# Patient Record
Sex: Female | Born: 1985
Health system: Southern US, Community
[De-identification: ages and names within clinical notes are randomized; demographics above are authoritative.]

## PROBLEM LIST (undated history)

## (undated) ENCOUNTER — Inpatient Hospital Stay (HOSPITAL_COMMUNITY): Payer: Self-pay

## (undated) DIAGNOSIS — Z789 Other specified health status: Secondary | ICD-10-CM

## (undated) DIAGNOSIS — N390 Urinary tract infection, site not specified: Secondary | ICD-10-CM

## (undated) DIAGNOSIS — B019 Varicella without complication: Secondary | ICD-10-CM

## (undated) DIAGNOSIS — Z8619 Personal history of other infectious and parasitic diseases: Secondary | ICD-10-CM

## (undated) HISTORY — DX: Personal history of other infectious and parasitic diseases: Z86.19

## (undated) HISTORY — DX: Urinary tract infection, site not specified: N39.0

## (undated) HISTORY — DX: Varicella without complication: B01.9

---

## 2005-01-08 ENCOUNTER — Emergency Department (HOSPITAL_COMMUNITY): Admission: EM | Admit: 2005-01-08 | Discharge: 2005-01-08 | Payer: Self-pay | Admitting: Emergency Medicine

## 2011-06-14 NOTE — L&D Delivery Note (Signed)
Delivery Note Pt complete at 1823. Pushing started at 1828, and pushed great to SVD at 6:49 PM. A viable female "Phoenix" was delivered via  (Presentation: Left Occiput Anterior w/ compound hand presentation); loose shoulder cord reduced at time of delivery.  Newborn w/ spontaneous cry w/ drying and stimulation.  Cord doubly clamped and cut by FOB.  APGAR: 9, 9; weight 6 lb 2.4 oz (2790 g).   Placenta status: Intact, Spontaneous, Tomasa Blase; Battledore insertion.  Cord: 3 vessels with the following complications: None.  Cord pH: n/a.  Anesthesia: Epidural  Episiotomy: None Lacerations: Sulcus; Lt of pt's ML; 2nd degree Suture Repair: 3-0 monocryl; 3 interrupted stitches Est. Blood Loss (mL): 150  Mom to postpartum.  Baby to nursery-stable. Plans to BF and inpatient circumcision. Pt's urine cleared after IVFB intrapartum; will CTO closely.  Jermiya Reichl H 02/19/2012, 7:22 PM

## 2011-07-21 ENCOUNTER — Inpatient Hospital Stay (HOSPITAL_COMMUNITY)
Admission: AD | Admit: 2011-07-21 | Discharge: 2011-07-21 | Disposition: A | Discharge status: Home / Self Care | Payer: Self-pay | Source: Ambulatory Visit | Attending: Obstetrics and Gynecology | Admitting: Obstetrics and Gynecology

## 2011-07-21 ENCOUNTER — Encounter (HOSPITAL_COMMUNITY): Payer: Self-pay | Admitting: *Deleted

## 2011-07-21 DIAGNOSIS — O210 Mild hyperemesis gravidarum: Secondary | ICD-10-CM | POA: Insufficient documentation

## 2011-07-21 DIAGNOSIS — K5900 Constipation, unspecified: Secondary | ICD-10-CM | POA: Insufficient documentation

## 2011-07-21 DIAGNOSIS — O2691 Pregnancy related conditions, unspecified, first trimester: Secondary | ICD-10-CM

## 2011-07-21 DIAGNOSIS — R110 Nausea: Secondary | ICD-10-CM

## 2011-07-21 HISTORY — DX: Other specified health status: Z78.9

## 2011-07-21 LAB — URINALYSIS, ROUTINE W REFLEX MICROSCOPIC
Bilirubin Urine: NEGATIVE
Nitrite: POSITIVE — AB
Protein, ur: NEGATIVE mg/dL
Specific Gravity, Urine: 1.025 (ref 1.005–1.030)
Urobilinogen, UA: 2 mg/dL — ABNORMAL HIGH (ref 0.0–1.0)

## 2011-07-21 LAB — URINE MICROSCOPIC-ADD ON

## 2011-07-21 MED ORDER — ONDANSETRON 8 MG PO TBDP: 8.0000 mg | ORAL_TABLET | Freq: Three times a day (TID) | ORAL | Status: AC | PRN

## 2011-07-21 MED ORDER — PROMETHAZINE HCL 50 MG PO TABS: 25.0000 mg | ORAL_TABLET | ORAL | Status: AC | PRN

## 2011-07-21 NOTE — Progress Notes (Signed)
History   26 yo g1p0 LMP certain 12/11 EDC 9/7 presents with c./o of nausea with constipation but had bm today, no vomiting with pregnancy, has PNC starting March, no diarrhea, constipation, or UTI s/s, no cramping or vag bleeding.  Chief Complaint  Patient presents with  . Morning Sickness  . Constipation     OB History    Grav Para Term Preterm Abortions TAB SAB Ect Mult Living   1               Past Medical History  Diagnosis Date  . No pertinent past medical history     History reviewed. No pertinent past surgical history.  Family History  Problem Relation Age of Onset  . Anesthesia problems Neg Hx     History  Substance Use Topics  . Smoking status: Never Smoker   . Smokeless tobacco: Never Used  . Alcohol Use: No    Allergies:  Allergies  Allergen Reactions  . Latex Itching    Prescriptions prior to admission  Medication Sig Dispense Refill  . Prenatal Vit-Fe Fumarate-FA (PRENATAL MULTIVITAMIN) TABS Take 1 tablet by mouth daily.         Physical Exam  abd soft, nontender, no distress, cheerful talkative Blood pressure 114/67, pulse 70, temperature 98.4 F (36.9 C), temperature source Oral, resp. rate 18, height 5\' 4"  (1.626 m), weight 173 lb (78.472 kg), last menstrual period 05/24/2011.    ED Course  8 week pregnancy by reliable LMP Nausea P discussed protein diet, RX zofran and phenergan, encouraged 8 water daily, miralax as needed, f/o office as scheduled. Lavera Guise, CNM

## 2011-08-12 ENCOUNTER — Encounter (INDEPENDENT_AMBULATORY_CARE_PROVIDER_SITE_OTHER): Payer: BC Managed Care – PPO

## 2011-08-12 DIAGNOSIS — Z3480 Encounter for supervision of other normal pregnancy, unspecified trimester: Secondary | ICD-10-CM

## 2011-08-12 LAB — RUBELLA ANTIBODY, IGM: Rubella: IMMUNE

## 2011-08-12 LAB — OB RESULTS CONSOLE ABO/RH: RH Type: POSITIVE

## 2011-08-12 LAB — OB RESULTS CONSOLE ANTIBODY SCREEN: Antibody Screen: NEGATIVE

## 2011-08-12 LAB — CBC
HCT: 38 % (ref 36–46)
Platelets: 239 10*3/uL (ref 150–399)

## 2011-08-12 LAB — OB RESULTS CONSOLE HEPATITIS B SURFACE ANTIGEN: Hepatitis B Surface Ag: NEGATIVE

## 2011-08-12 LAB — OB RESULTS CONSOLE HIV ANTIBODY (ROUTINE TESTING): HIV: NONREACTIVE

## 2011-08-23 ENCOUNTER — Encounter (INDEPENDENT_AMBULATORY_CARE_PROVIDER_SITE_OTHER): Payer: BC Managed Care – PPO

## 2011-08-23 ENCOUNTER — Other Ambulatory Visit: Payer: BC Managed Care – PPO

## 2011-08-23 DIAGNOSIS — Z36 Encounter for antenatal screening of mother: Secondary | ICD-10-CM

## 2011-08-29 ENCOUNTER — Other Ambulatory Visit: Payer: BC Managed Care – PPO | Admitting: Registered Nurse

## 2011-08-29 ENCOUNTER — Encounter (INDEPENDENT_AMBULATORY_CARE_PROVIDER_SITE_OTHER): Payer: BC Managed Care – PPO | Admitting: Obstetrics and Gynecology

## 2011-08-29 DIAGNOSIS — Z01419 Encounter for gynecological examination (general) (routine) without abnormal findings: Secondary | ICD-10-CM

## 2011-08-29 DIAGNOSIS — Z331 Pregnant state, incidental: Secondary | ICD-10-CM

## 2011-09-08 ENCOUNTER — Other Ambulatory Visit (INDEPENDENT_AMBULATORY_CARE_PROVIDER_SITE_OTHER): Payer: BC Managed Care – PPO

## 2011-09-08 DIAGNOSIS — Z331 Pregnant state, incidental: Secondary | ICD-10-CM

## 2011-09-23 DIAGNOSIS — Z8279 Family history of other congenital malformations, deformations and chromosomal abnormalities: Secondary | ICD-10-CM | POA: Insufficient documentation

## 2011-09-23 DIAGNOSIS — N926 Irregular menstruation, unspecified: Secondary | ICD-10-CM | POA: Insufficient documentation

## 2011-09-26 ENCOUNTER — Encounter: Payer: Self-pay | Admitting: Obstetrics and Gynecology

## 2011-09-26 ENCOUNTER — Ambulatory Visit (INDEPENDENT_AMBULATORY_CARE_PROVIDER_SITE_OTHER): Payer: BC Managed Care – PPO | Admitting: Obstetrics and Gynecology

## 2011-09-26 VITALS — BP 118/60 | Wt 171.0 lb

## 2011-09-26 DIAGNOSIS — Z8279 Family history of other congenital malformations, deformations and chromosomal abnormalities: Secondary | ICD-10-CM

## 2011-09-26 NOTE — Progress Notes (Signed)
Pt c/o continued morning sickness and no apetite, unsure what fetal movement feels like.

## 2011-09-26 NOTE — Progress Notes (Signed)
Nausea improved.  Appetite poor.  Recommended small frequent meals. First trimester screen, AFP nl  NOB labs reviewed

## 2011-10-10 ENCOUNTER — Ambulatory Visit (INDEPENDENT_AMBULATORY_CARE_PROVIDER_SITE_OTHER): Payer: BC Managed Care – PPO | Admitting: Obstetrics and Gynecology

## 2011-10-10 ENCOUNTER — Ambulatory Visit (INDEPENDENT_AMBULATORY_CARE_PROVIDER_SITE_OTHER): Payer: BC Managed Care – PPO

## 2011-10-10 ENCOUNTER — Encounter: Payer: Self-pay | Admitting: Obstetrics and Gynecology

## 2011-10-10 ENCOUNTER — Other Ambulatory Visit: Payer: BC Managed Care – PPO

## 2011-10-10 VITALS — BP 90/70 | Wt 171.0 lb

## 2011-10-10 DIAGNOSIS — Z8279 Family history of other congenital malformations, deformations and chromosomal abnormalities: Secondary | ICD-10-CM

## 2011-10-10 DIAGNOSIS — Z3400 Encounter for supervision of normal first pregnancy, unspecified trimester: Secondary | ICD-10-CM

## 2011-10-10 LAB — US OB COMP + 14 WK

## 2011-10-10 NOTE — Progress Notes (Signed)
Ultrasound shows:  SIUP  S=D                 AFI: nl            Cervical length: closed 3.76 cm           Placenta localization: anterior           Fetal presentation: cephalic                   Anatomy survey is normal           Gender : female  No complaints Questions answered U/s reviewed RTO 4wks

## 2011-11-08 ENCOUNTER — Ambulatory Visit (INDEPENDENT_AMBULATORY_CARE_PROVIDER_SITE_OTHER): Payer: BC Managed Care – PPO | Admitting: Obstetrics and Gynecology

## 2011-11-08 VITALS — BP 122/66 | Wt 172.0 lb

## 2011-11-08 DIAGNOSIS — Z3480 Encounter for supervision of other normal pregnancy, unspecified trimester: Secondary | ICD-10-CM

## 2011-11-08 DIAGNOSIS — Z3490 Encounter for supervision of normal pregnancy, unspecified, unspecified trimester: Secondary | ICD-10-CM | POA: Insufficient documentation

## 2011-11-08 NOTE — Progress Notes (Signed)
Doing well 1hr glu NV Blood type Apos

## 2011-12-06 ENCOUNTER — Encounter: Payer: Self-pay | Admitting: Obstetrics and Gynecology

## 2011-12-06 ENCOUNTER — Other Ambulatory Visit: Payer: BC Managed Care – PPO

## 2011-12-06 ENCOUNTER — Ambulatory Visit (INDEPENDENT_AMBULATORY_CARE_PROVIDER_SITE_OTHER): Payer: BC Managed Care – PPO | Admitting: Obstetrics and Gynecology

## 2011-12-06 VITALS — BP 90/60 | Wt 177.0 lb

## 2011-12-06 DIAGNOSIS — Z331 Pregnant state, incidental: Secondary | ICD-10-CM

## 2011-12-06 NOTE — Progress Notes (Signed)
Glucola, Hgb, RPR today.  A+. Just returned from [place] to see mom--misses her very much. Mom is coming to [facility] when she has the baby. Support offered, reassured.

## 2011-12-06 NOTE — Progress Notes (Signed)
No complaints. glucola given.

## 2011-12-07 LAB — RPR

## 2011-12-09 LAB — GLUCOSE TOLERANCE, 1 HOUR (50G) W/O FASTING: Glucose, 1 Hour GTT: 119 mg/dL (ref 70–140)

## 2011-12-19 ENCOUNTER — Encounter: Payer: Self-pay | Admitting: Obstetrics and Gynecology

## 2011-12-19 ENCOUNTER — Ambulatory Visit (INDEPENDENT_AMBULATORY_CARE_PROVIDER_SITE_OTHER): Payer: BC Managed Care – PPO | Admitting: Obstetrics and Gynecology

## 2011-12-19 VITALS — BP 98/56 | Wt 175.0 lb

## 2011-12-19 DIAGNOSIS — Z331 Pregnant state, incidental: Secondary | ICD-10-CM

## 2011-12-19 NOTE — Progress Notes (Signed)
glucola 119, RPR NR, HGB 10.9 FKC reviewed RT 2 weeks

## 2011-12-19 NOTE — Patient Instructions (Signed)
Fetal Monitoring, Fetal Movement Assessment Fetal movement assessment (FMA) is done by the pregnant woman herself by counting and recording the baby's movements over a certain time period. It is done to see if there are problems with the pregnancy and the baby. Identifying and correcting problems may prevent serious problems from developing with the fetus, including fetal loss. Some pregnancies are complicated by the mother's medical problems. Some of these problems are type 1 diabetes mellitus, high blood pressure and other chronic medical illnesses. This is why it is important to monitor the baby before birth.  OTHER TECHNIQUES OF MONITORING YOUR BABY BEFORE BIRTH Several tests are in use. These include:  Nonstress test (NST). This test monitors the baby's heart rate when the baby moves.   Contraction stress test (CST). This test monitors the baby's heart rate during a contraction of the uterus.   Fetal biophysical profile (BPP). This measures and evaluates 5 observations of the baby:   The nonstress test.   The baby's breathing.   The baby's movements.   The baby's muscle tone.   The amount of amniotic fluid.   Modified BPP. This measures the volume of fluid in different parts of the amniotic sac (amniotic fluid index) and the results of the nonstress test.   Umbilical artery doppler velocimetry. This evaluates the blood flow through the umbilical cord.  There are several very serious problems that cannot be predicted or detected with any of the fetal monitoring procedures. These problems include separation (abruption) of the placenta or when the fetus chokes on the umbilical cord (umbilical cord accident). Your caregiver will help you understand your tests and what they mean for you and your baby. It is your responsibility to obtain the results of your test. LET YOUR CAREGIVER KNOW ABOUT:   Any medications you are taking including prescription and over-the-counter drugs, herbs, eye  drops and creams.   If you have a fever.   If you have an infection.   If you are sick.  RISKS AND COMPLICATIONS  There are no risks or complications to the mother or fetus with FMA. BEFORE THE PROCEDURE  Do not take medications that may decrease or increase the baby's heart rate and/or movements.   Eat a full meal at least 2 hours before the test.   Do not smoke if you are pregnant. If you smoke, stop at least 2 days before the test. It is best not to smoke at all when you are pregnant.  PROCEDURE Sometimes, a mother notices her baby moves less before there are problems. Because of this, it is believed that fetal movement checking by the mother (kick counts) is a good way to check the baby before birth. There are different ways of doing this. Two good ways are:  The woman lies on her side and counts distinct (individual) fetal movements. A feeling of 10 distinct movements in a period of up to 2 hours is considered reassuring. When 10 movements are felt, you may stop counting.   Women are instructed to count fetal movements for 1 hour, three times per week. The count is good if, after one week, it equals or is over the woman's previously established baseline count. If the count is lower, further checking of your baby is needed.  AFTER THE PROCEDURE You may resume your usual activities. HOME CARE INSTRUCTIONS   Follow your caregiver's advice and recommendations.   Be aware of your baby's movements. Are they normal, less than usual or more than usual?     Make and keep the rest of your prenatal appointments.  SEEK MEDICAL CARE IF:   You develop a temperature of 100 F (37.8 C) or higher.   You have a bloody mucus discharge from the vagina (a bloody show).  SEEK IMMEDIATE MEDICAL CARE IF:   You do not feel the baby move.   You think the baby's movements are too little or too many.   You develop contractions.   You develop vaginal bleeding.   You have belly (abdominal) pain.    You have leaking or a gush of fluid from the vagina.  Document Released: 05/20/2002 Document Revised: 05/19/2011 Document Reviewed: 09/22/2008 [employer] Patient Information 2012 [employer]. 

## 2011-12-19 NOTE — Progress Notes (Signed)
Pt states no concerns today.   

## 2012-01-02 ENCOUNTER — Ambulatory Visit (INDEPENDENT_AMBULATORY_CARE_PROVIDER_SITE_OTHER): Payer: BC Managed Care – PPO | Admitting: Obstetrics and Gynecology

## 2012-01-02 ENCOUNTER — Encounter: Payer: Self-pay | Admitting: Obstetrics and Gynecology

## 2012-01-02 VITALS — BP 102/62 | Wt 177.0 lb

## 2012-01-02 DIAGNOSIS — D649 Anemia, unspecified: Secondary | ICD-10-CM

## 2012-01-02 DIAGNOSIS — Z3490 Encounter for supervision of normal pregnancy, unspecified, unspecified trimester: Secondary | ICD-10-CM

## 2012-01-02 DIAGNOSIS — Z331 Pregnant state, incidental: Secondary | ICD-10-CM

## 2012-01-02 NOTE — Progress Notes (Signed)
No complaints

## 2012-01-02 NOTE — Addendum Note (Signed)
Addended by: Janeece Agee on: 01/02/2012 10:45 AM   Modules accepted: Orders

## 2012-01-02 NOTE — Progress Notes (Signed)
[redacted]w[redacted]d Anemia - ( 10.9) will start po Iron to help. ROb x 2 weeks Repeat CBC at next visit.

## 2012-01-04 ENCOUNTER — Telehealth: Payer: Self-pay | Admitting: Obstetrics and Gynecology

## 2012-01-17 ENCOUNTER — Ambulatory Visit (INDEPENDENT_AMBULATORY_CARE_PROVIDER_SITE_OTHER): Payer: BC Managed Care – PPO

## 2012-01-17 VITALS — BP 116/68 | Wt 177.0 lb

## 2012-01-17 DIAGNOSIS — H9209 Otalgia, unspecified ear: Secondary | ICD-10-CM

## 2012-01-17 DIAGNOSIS — H9202 Otalgia, left ear: Secondary | ICD-10-CM

## 2012-01-17 NOTE — Progress Notes (Signed)
C/O: Pain in left ear since Saturday.

## 2012-01-17 NOTE — Progress Notes (Signed)
[redacted]w[redacted]d.  C/o Lt ear pain since Sat.  No other c/o's.  Heat and cold sensitivity and pain w/ chewing.  Using heating pad at night. Pain worse w/ meals and after lying down at night.  Ears WNL bilaterally.  Still has wisdom teeth and suspect tooth issue vs. Ear or sinus.  No headache or sinus pressure.  Rec'd ES Tylenol q 6 hrs prn; warm water bottle to ear prn comfort instead of heating pad and rec'd seeking dental exam.  No PTL s/s.  S.o. At visit.  disc'd circ.  Plans to BF.  No PTL s/s.  GFM.  GBS NV.

## 2012-02-01 ENCOUNTER — Ambulatory Visit (INDEPENDENT_AMBULATORY_CARE_PROVIDER_SITE_OTHER): Payer: BC Managed Care – PPO

## 2012-02-01 VITALS — BP 108/72 | Wt 178.0 lb

## 2012-02-01 DIAGNOSIS — Z331 Pregnant state, incidental: Secondary | ICD-10-CM

## 2012-02-01 DIAGNOSIS — Z3490 Encounter for supervision of normal pregnancy, unspecified, unspecified trimester: Secondary | ICD-10-CM

## 2012-02-01 NOTE — Progress Notes (Signed)
Pt desires cervix check today. Pt has no concerns.

## 2012-02-01 NOTE — Progress Notes (Signed)
[redacted]w[redacted]d Ear pain has improved--think TMJ; has been laying more on Rt side.  GFM.  Rev'd labor s/s & FKC. Declined pelvic after wait time; GBS NV.

## 2012-02-09 ENCOUNTER — Ambulatory Visit (INDEPENDENT_AMBULATORY_CARE_PROVIDER_SITE_OTHER): Payer: BC Managed Care – PPO | Admitting: Obstetrics and Gynecology

## 2012-02-09 ENCOUNTER — Encounter: Payer: Self-pay | Admitting: Obstetrics and Gynecology

## 2012-02-09 VITALS — BP 118/70 | Wt 180.0 lb

## 2012-02-09 DIAGNOSIS — Z3480 Encounter for supervision of other normal pregnancy, unspecified trimester: Secondary | ICD-10-CM

## 2012-02-09 LAB — OB RESULTS CONSOLE GBS: GBS: NEGATIVE

## 2012-02-09 NOTE — Progress Notes (Signed)
Pt states no concerns today.   

## 2012-02-09 NOTE — Progress Notes (Signed)
A/P GBS done Fetal kick counts reviewed Labor reviewed with pt All patients  questions answered 

## 2012-02-09 NOTE — Patient Instructions (Signed)

## 2012-02-12 LAB — CULTURE, BETA STREP (GROUP B ONLY)

## 2012-02-16 ENCOUNTER — Ambulatory Visit (INDEPENDENT_AMBULATORY_CARE_PROVIDER_SITE_OTHER): Payer: BC Managed Care – PPO | Admitting: Obstetrics and Gynecology

## 2012-02-16 ENCOUNTER — Encounter: Payer: Self-pay | Admitting: Obstetrics and Gynecology

## 2012-02-16 VITALS — BP 102/62 | Wt 180.0 lb

## 2012-02-16 DIAGNOSIS — Z331 Pregnant state, incidental: Secondary | ICD-10-CM

## 2012-02-16 DIAGNOSIS — Z3490 Encounter for supervision of normal pregnancy, unspecified, unspecified trimester: Secondary | ICD-10-CM

## 2012-02-16 NOTE — Progress Notes (Signed)
Office Visit on 02/09/2012  Component Date Value Range Status  . Organism ID, Bacteria 02/09/2012 NO GROUP B STREP (S.AGALACTIAE) ISOLATED   Final   Wants cervix checked No complaints other than pressure FKCs and Labor Precautions

## 2012-02-19 ENCOUNTER — Inpatient Hospital Stay (HOSPITAL_COMMUNITY): Payer: BC Managed Care – PPO | Admitting: Anesthesiology

## 2012-02-19 ENCOUNTER — Encounter (HOSPITAL_COMMUNITY): Payer: Self-pay | Admitting: Anesthesiology

## 2012-02-19 ENCOUNTER — Encounter (HOSPITAL_COMMUNITY): Payer: Self-pay | Admitting: *Deleted

## 2012-02-19 ENCOUNTER — Inpatient Hospital Stay (HOSPITAL_COMMUNITY)
Admission: AD | Admit: 2012-02-19 | Discharge: 2012-02-21 | DRG: 373 | Disposition: A | Discharge status: Home / Self Care | Payer: BC Managed Care – PPO | Source: Ambulatory Visit | Attending: Obstetrics and Gynecology | Admitting: Obstetrics and Gynecology

## 2012-02-19 LAB — CBC
MCH: 29.4 pg (ref 26.0–34.0)
MCHC: 33.8 g/dL (ref 30.0–36.0)
Platelets: 176 10*3/uL (ref 150–400)
RBC: 4.45 MIL/uL (ref 3.87–5.11)

## 2012-02-19 LAB — RPR: RPR Ser Ql: NONREACTIVE

## 2012-02-19 MED ORDER — FENTANYL 2.5 MCG/ML BUPIVACAINE 1/10 % EPIDURAL INFUSION (WH - ANES)
14.0000 mL/h | INTRAMUSCULAR | Status: DC
  Administered 2012-02-19 (×2): 14 mL/h via EPIDURAL
  Filled 2012-02-19 (×3): qty 60

## 2012-02-19 MED ORDER — CITRIC ACID-SODIUM CITRATE 334-500 MG/5ML PO SOLN: 30.0000 mL | ORAL | Status: DC | PRN

## 2012-02-19 MED ORDER — SENNOSIDES-DOCUSATE SODIUM 8.6-50 MG PO TABS
2.0000 | ORAL_TABLET | Freq: Every day | ORAL | Status: DC
  Administered 2012-02-19 – 2012-02-20 (×2): 2 via ORAL

## 2012-02-19 MED ORDER — ONDANSETRON HCL 4 MG/2ML IJ SOLN: 4.0000 mg | INTRAMUSCULAR | Status: DC | PRN

## 2012-02-19 MED ORDER — ACETAMINOPHEN 160 MG/5ML PO SOLN
650.0000 mg | Freq: Four times a day (QID) | ORAL | Status: DC | PRN
  Filled 2012-02-19: qty 20.3

## 2012-02-19 MED ORDER — OXYTOCIN BOLUS FROM INFUSION
500.0000 mL | Freq: Once | INTRAVENOUS | Status: DC
  Filled 2012-02-19: qty 500

## 2012-02-19 MED ORDER — ONDANSETRON HCL 4 MG PO TABS: 4.0000 mg | ORAL_TABLET | ORAL | Status: DC | PRN

## 2012-02-19 MED ORDER — ONDANSETRON HCL 4 MG/2ML IJ SOLN: 4.0000 mg | Freq: Four times a day (QID) | INTRAMUSCULAR | Status: DC | PRN

## 2012-02-19 MED ORDER — LIDOCAINE HCL (PF) 1 % IJ SOLN: 30.0000 mL | INTRAMUSCULAR | Status: DC | PRN

## 2012-02-19 MED ORDER — PRENATAL MULTIVITAMIN CH
1.0000 | ORAL_TABLET | Freq: Every day | ORAL | Status: DC
  Administered 2012-02-21: 1 via ORAL
  Filled 2012-02-19 (×2): qty 1

## 2012-02-19 MED ORDER — EPHEDRINE 5 MG/ML INJ
10.0000 mg | INTRAVENOUS | Status: DC | PRN
  Filled 2012-02-19: qty 4

## 2012-02-19 MED ORDER — LANOLIN HYDROUS EX OINT: TOPICAL_OINTMENT | CUTANEOUS | Status: DC | PRN

## 2012-02-19 MED ORDER — DIBUCAINE 1 % RE OINT: 1.0000 "application " | TOPICAL_OINTMENT | RECTAL | Status: DC | PRN

## 2012-02-19 MED ORDER — SIMETHICONE 80 MG PO CHEW: 80.0000 mg | CHEWABLE_TABLET | ORAL | Status: DC | PRN

## 2012-02-19 MED ORDER — WITCH HAZEL-GLYCERIN EX PADS: 1.0000 "application " | MEDICATED_PAD | CUTANEOUS | Status: DC | PRN

## 2012-02-19 MED ORDER — PHENYLEPHRINE 40 MCG/ML (10ML) SYRINGE FOR IV PUSH (FOR BLOOD PRESSURE SUPPORT)
80.0000 ug | PREFILLED_SYRINGE | INTRAVENOUS | Status: DC | PRN
  Filled 2012-02-19: qty 5

## 2012-02-19 MED ORDER — ACETAMINOPHEN 325 MG PO TABS: 650.0000 mg | ORAL_TABLET | ORAL | Status: DC | PRN

## 2012-02-19 MED ORDER — METHYLERGONOVINE MALEATE 0.2 MG/ML IJ SOLN: 0.2000 mg | INTRAMUSCULAR | Status: DC | PRN

## 2012-02-19 MED ORDER — METHYLERGONOVINE MALEATE 0.2 MG PO TABS: 0.2000 mg | ORAL_TABLET | ORAL | Status: DC | PRN

## 2012-02-19 MED ORDER — EPHEDRINE 5 MG/ML INJ: 10.0000 mg | INTRAVENOUS | Status: DC | PRN

## 2012-02-19 MED ORDER — LACTATED RINGERS IV SOLN: INTRAVENOUS | Status: DC

## 2012-02-19 MED ORDER — OXYTOCIN 40 UNITS IN LACTATED RINGERS INFUSION - SIMPLE MED: 62.5000 mL/h | Freq: Once | INTRAVENOUS | Status: DC

## 2012-02-19 MED ORDER — IBUPROFEN 600 MG PO TABS: 600.0000 mg | ORAL_TABLET | Freq: Four times a day (QID) | ORAL | Status: DC

## 2012-02-19 MED ORDER — IBUPROFEN 100 MG/5ML PO SUSP
600.0000 mg | Freq: Four times a day (QID) | ORAL | Status: DC | PRN
  Filled 2012-02-19: qty 30

## 2012-02-19 MED ORDER — DIPHENHYDRAMINE HCL 50 MG/ML IJ SOLN: 12.5000 mg | INTRAMUSCULAR | Status: DC | PRN

## 2012-02-19 MED ORDER — LACTATED RINGERS IV SOLN: 500.0000 mL | INTRAVENOUS | Status: DC | PRN

## 2012-02-19 MED ORDER — IBUPROFEN 600 MG PO TABS
600.0000 mg | ORAL_TABLET | Freq: Four times a day (QID) | ORAL | Status: DC | PRN
  Filled 2012-02-19: qty 1

## 2012-02-19 MED ORDER — PHENYLEPHRINE 40 MCG/ML (10ML) SYRINGE FOR IV PUSH (FOR BLOOD PRESSURE SUPPORT): 80.0000 ug | PREFILLED_SYRINGE | INTRAVENOUS | Status: DC | PRN

## 2012-02-19 MED ORDER — LIDOCAINE HCL (PF) 1 % IJ SOLN
INTRAMUSCULAR | Status: DC | PRN
  Administered 2012-02-19 (×2): 4 mL

## 2012-02-19 MED ORDER — DIPHENHYDRAMINE HCL 25 MG PO CAPS: 25.0000 mg | ORAL_CAPSULE | Freq: Four times a day (QID) | ORAL | Status: DC | PRN

## 2012-02-19 MED ORDER — TETANUS-DIPHTH-ACELL PERTUSSIS 5-2.5-18.5 LF-MCG/0.5 IM SUSP
0.5000 mL | Freq: Once | INTRAMUSCULAR | Status: AC
  Administered 2012-02-20: 0.5 mL via INTRAMUSCULAR
  Filled 2012-02-19: qty 0.5

## 2012-02-19 MED ORDER — OXYTOCIN 40 UNITS IN LACTATED RINGERS INFUSION - SIMPLE MED: 1.0000 m[IU]/min | INTRAVENOUS | Status: DC

## 2012-02-19 MED ORDER — FLEET ENEMA 7-19 GM/118ML RE ENEM: 1.0000 | ENEMA | RECTAL | Status: DC | PRN

## 2012-02-19 MED ORDER — FENTANYL 2.5 MCG/ML BUPIVACAINE 1/10 % EPIDURAL INFUSION (WH - ANES)
INTRAMUSCULAR | Status: DC | PRN
  Administered 2012-02-19: 14 mL/h via EPIDURAL

## 2012-02-19 MED ORDER — OXYCODONE-ACETAMINOPHEN 5-325 MG PO TABS: 1.0000 | ORAL_TABLET | ORAL | Status: DC | PRN

## 2012-02-19 MED ORDER — OXYTOCIN 40 UNITS IN LACTATED RINGERS INFUSION - SIMPLE MED
1.0000 m[IU]/min | INTRAVENOUS | Status: DC
  Administered 2012-02-19: 1 m[IU]/min via INTRAVENOUS
  Filled 2012-02-19: qty 1000

## 2012-02-19 MED ORDER — BENZOCAINE-MENTHOL 20-0.5 % EX AERO
1.0000 "application " | INHALATION_SPRAY | CUTANEOUS | Status: DC | PRN
  Administered 2012-02-19: 1 via TOPICAL
  Filled 2012-02-19: qty 56

## 2012-02-19 MED ORDER — ZOLPIDEM TARTRATE 5 MG PO TABS: 5.0000 mg | ORAL_TABLET | Freq: Every evening | ORAL | Status: DC | PRN

## 2012-02-19 MED ORDER — OXYCODONE-ACETAMINOPHEN 5-325 MG/5ML PO SOLN: 5.0000 mL | ORAL | Status: DC | PRN

## 2012-02-19 MED ORDER — LACTATED RINGERS IV SOLN: 500.0000 mL | Freq: Once | INTRAVENOUS | Status: DC

## 2012-02-19 MED ORDER — HYDROXYZINE HCL 50 MG PO TABS: 50.0000 mg | ORAL_TABLET | Freq: Four times a day (QID) | ORAL | Status: DC | PRN

## 2012-02-19 MED ORDER — IBUPROFEN 100 MG/5ML PO SUSP
600.0000 mg | Freq: Four times a day (QID) | ORAL | Status: DC
  Administered 2012-02-19 – 2012-02-21 (×6): 600 mg via ORAL
  Filled 2012-02-19 (×7): qty 30

## 2012-02-19 MED ORDER — HYDROXYZINE HCL 50 MG/ML IM SOLN: 50.0000 mg | Freq: Four times a day (QID) | INTRAMUSCULAR | Status: DC | PRN

## 2012-02-19 MED ORDER — TERBUTALINE SULFATE 1 MG/ML IJ SOLN: 0.2500 mg | Freq: Once | INTRAMUSCULAR | Status: DC | PRN

## 2012-02-19 MED ORDER — MAGNESIUM HYDROXIDE 400 MG/5ML PO SUSP: 30.0000 mL | ORAL | Status: DC | PRN

## 2012-02-19 NOTE — Anesthesia Procedure Notes (Signed)
Epidural Patient location during procedure: OB Start time: 02/19/2012 9:05 AM  Staffing Anesthesiologist: Najai Waszak A. Performed by: anesthesiologist   Preanesthetic Checklist Completed: patient identified, site marked, surgical consent, pre-op evaluation, timeout performed, IV checked, risks and benefits discussed and monitors and equipment checked  Epidural Patient position: sitting Prep: site prepped and draped and DuraPrep Patient monitoring: continuous pulse ox and blood pressure Approach: midline Injection technique: LOR air  Needle:  Needle type: Tuohy  Needle gauge: 17 G Needle length: 9 cm and 9 Needle insertion depth: 7 cm Catheter type: closed end flexible Catheter size: 19 Gauge Catheter at skin depth: 12 cm Test dose: negative and Other  Assessment Events: blood not aspirated, injection not painful, no injection resistance, negative IV test and no paresthesia  Additional Notes Patient identified. Risks and benefits discussed including failed block, incomplete  Pain control, post dural puncture headache, nerve damage, paralysis, blood pressure Changes, nausea, vomiting, reactions to medications-both toxic and allergic and post Partum back pain. All questions were answered. Patient expressed understanding and wished to proceed. Sterile technique was used throughout procedure. Epidural site was Dressed with sterile barrier dressing. No paresthesias, signs of intravascular injection Or signs of intrathecal spread were encountered.  Patient was more comfortable after the epidural was dosed. Please see RN's note for documentation of vital signs and FHR which are stable.

## 2012-02-19 NOTE — Anesthesia Preprocedure Evaluation (Signed)

## 2012-02-19 NOTE — Progress Notes (Signed)
Subjective: Pt comfortable s/p epidural; received around 0900.  Several visitors at bedside.  Pt smiling and w/o c/o's.  Objective: BP 120/80  Pulse 80  Temp 98.1 F (36.7 C) (Oral)  Resp 20  Ht 5\' 4"  (1.626 m)  Wt 182 lb (82.555 kg)  BMI 31.24 kg/m2  SpO2 99%  LMP 05/24/2011      FHT:  FHR: 130 bpm, variability: moderate,  accelerations:  Present,  decelerations:  Absent UC:   regular, every 2-3 minutes SVE:   Dilation: 7 Effacement (%): 90 Station: -1 Exam by:: Matilynn Dacey AROM mod amt clear fluid w/ sm amt bloody show Labs: Lab Results  Component Value Date   WBC 10.0 02/19/2012   HGB 13.1 02/19/2012   HCT 38.8 02/19/2012   MCV 87.2 02/19/2012   PLT 176 02/19/2012    Assessment / Plan: 1. [redacted]w[redacted]d 2. transition 3. GBS neg  Labor: Progressing normally Preeclampsia:  no signs or symptoms of toxicity Fetal Wellbeing:  Category I Pain Control:  Epidural I/D:  n/a Anticipated MOD:  NSVD 1.  Pitocin/IUPC prn augmentation 2.  C/w MD prn  Meloni Hinz H 02/19/2012, 11:38 AM

## 2012-02-19 NOTE — Progress Notes (Signed)
Subjective: Called to assess pt's urine in foley--now bloody w/ clot in foley bag.  RN also reports cx puffy anteriorly between 9-1 o'clock.  Pitocin started at 1500; currently on 3mu.  RN reports when i/o cath earlier today, urine yellow and clear.  Objective: BP 131/71  Pulse 84  Temp 98.5 F (36.9 C) (Oral)  Resp 20  Ht 5\' 4"  (1.626 m)  Wt 182 lb (82.555 kg)  BMI 31.24 kg/m2  SpO2 99%  LMP 05/24/2011      FHT:  FHR: 140 bpm, variability: moderate,  accelerations:  Present,  decelerations:  Present occ'l variable w/ late component UC:   irregular, every 1-6 minutes SVE:   Dilation: 7 Effacement (%): 90 Station: -1 Exam by:: m wilkins rnc Deferred cervical exam; urine in foley catheter bloody w/ clot noted.   MVU's inadequate.   Labs: Lab Results  Component Value Date   WBC 10.0 02/19/2012   HGB 13.1 02/19/2012   HCT 38.8 02/19/2012   MCV 87.2 02/19/2012   PLT 176 02/19/2012    Assessment / Plan: 1. [redacted]w[redacted]d 2. protracted active phase 3. suspect OP presentation 4.  blood in urine 5. GBS neg  Labor: augmentation w/ dysfunctional ctxs pattern w/ OP suspected Preeclampsia:  no signs or symptoms of toxicity Fetal Wellbeing:  Category I Pain Control:  Epidural I/D:  n/a Anticipated MOD:  NSVD 1.  Repositioned pt to Lt exaggerated Sims and will do IVFB now.  Will CTO closely. 2.  Will reposition frequently to attempt fetal rotation. 3.  C/w MD prn. Allee Busk H 02/19/2012, 4:47 PM

## 2012-02-19 NOTE — Progress Notes (Signed)
Subjective: No c/o's.  Some intermittent rectal pressure.  S.o. Remains at bedside and rotating guests.   Objective: BP 128/71  Pulse 74  Temp 98.2 F (36.8 C) (Oral)  Resp 20  Ht 5\' 4"  (1.626 m)  Wt 182 lb (82.555 kg)  BMI 31.24 kg/m2  SpO2 99%  LMP 05/24/2011      FHT:  FHR: 135 bpm, variability: moderate,  accelerations:  Present,  decelerations:  Present one prolonged decel around 1220; resolved w/ intervention and none since UC:   regular, every 2-4 minutes; sometimes couplet pattern SVE:   Dilation: 7 Effacement (%): 90 Station: -1 Exam by:: m wilkins rnc cx unchanged; IUPC inserted Labs: Lab Results  Component Value Date   WBC 10.0 02/19/2012   HGB 13.1 02/19/2012   HCT 38.8 02/19/2012   MCV 87.2 02/19/2012   PLT 176 02/19/2012    Assessment / Plan: Protracted active phase  Labor: no progress since AROM at 1130 Preeclampsia:  no signs or symptoms of toxicity Fetal Wellbeing:  Category I Pain Control:  Epidural I/D:  n/a Anticipated MOD:  NSVD 1.  If inadequate, will start Pitocin per low dose 2.  C/w MD prn  Jonia Oakey H 02/19/2012, 2:29 PM

## 2012-02-20 ENCOUNTER — Encounter (HOSPITAL_COMMUNITY): Payer: Self-pay | Admitting: *Deleted

## 2012-02-20 LAB — CBC
HCT: 32.2 % — ABNORMAL LOW (ref 36.0–46.0)
Hemoglobin: 10.8 g/dL — ABNORMAL LOW (ref 12.0–15.0)
MCV: 88.2 fL (ref 78.0–100.0)
RBC: 3.65 MIL/uL — ABNORMAL LOW (ref 3.87–5.11)
RDW: 14.3 % (ref 11.5–15.5)
WBC: 17.7 10*3/uL — ABNORMAL HIGH (ref 4.0–10.5)

## 2012-02-20 NOTE — Progress Notes (Signed)
Post Partum Day 1:S/P SVB Subjective: Doing well--up ad lib without syncope or dizziness.  Voiding spontaneously without difficulty or hematuria.  Plans inpatient circumcision. Feeding:  Breast Contraceptive plan:   Undecided at present.  Objective: Blood pressure 114/69, pulse 78, temperature 98 F (36.7 C), temperature source Oral, resp. rate 18, height 5\' 4"  (1.626 m), weight 182 lb (82.555 kg), last menstrual period 05/24/2011, SpO2 99.00%, unknown if currently breastfeeding.  Physical Exam:  General: alert Lochia: appropriate Uterine Fundus: firm Incision: healing well  DVT Evaluation: No evidence of DVT seen on physical exam. Negative Homan's sign.   Basename 02/20/12 0443 02/19/12 0825  HGB 10.8* 13.1  HCT 32.2* 38.8    Assessment/Plan: PP day 1--stable. Anticipate d/c tomorrow.   LOS: 1 day   Hyden Soley 02/20/2012, 8:50 AM

## 2012-02-20 NOTE — Anesthesia Postprocedure Evaluation (Signed)
  Anesthesia Post-op Note  Patient: Wanda Hammond  Procedure(s) Performed: * No procedures listed *  Patient Location: PACU and Mother/Baby  Anesthesia Type: Epidural  Level of Consciousness: awake, alert  and oriented  Airway and Oxygen Therapy: Patient Spontanous Breathing  Post-op Pain: none  Post-op Assessment: Post-op Vital signs reviewed, Patient's Cardiovascular Status Stable, No headache, No backache, No residual numbness and No residual motor weakness  Post-op Vital Signs: Reviewed and stable  Complications: No apparent anesthesia complications

## 2012-02-21 ENCOUNTER — Encounter: Payer: BC Managed Care – PPO | Admitting: Obstetrics and Gynecology

## 2012-02-21 MED ORDER — IBUPROFEN 200 MG PO TABS: 200.0000 mg | ORAL_TABLET | Freq: Four times a day (QID) | ORAL | Status: AC | PRN

## 2012-02-21 NOTE — Discharge Summary (Signed)
Physician Discharge Summary  Patient ID: Wanda Hammond MRN: [id number] DOB/AGE: 09/02/1985 26 y.o.  Admit date: 02/19/2012 Discharge date: 02/21/2012  Admission Diagnoses: active labor with advanced dilation at 7cms  Discharge Diagnoses:  Principal Problem:  *NSVD (normal spontaneous vaginal delivery) Active Problems:  Laceration of vaginal wall or sulcus without perineal laceration during delivery   Discharged Condition: stable  Hospital Course: Admitted to [facility] suite  02/19/12  In active labor with advanced dilation at 7 cms, Epidural, augmentation with Pitocin. Progressed to fully dilated. NSVD Viable female infant "Pheonix". Placenta delivered intact, [employer]. 2nd degree laceration with Lt- ML sulcus tear.  Both mother and baby tolerated labor well and pp recovery has been uncomplicated. Baby had in hospital circumcision which was uncomplicated. Lactating mother. Mother and baby discharged to home D2 PP. Birth Control: desires Mirena at 6 weeks.  Consults: None  Significant Diagnostic Studies: routine labs which were in normal limits.  Treatments: IV hydration and analgesia: Fentanyl in labor and Epidural.  Discharge Exam: Blood pressure 118/68, pulse 76, temperature 97.8 F (36.6 C), temperature source Oral, resp. rate 18, height 5\' 4"  (1.626 m), weight 182 lb (82.555 kg), last menstrual period 05/24/2011, SpO2 99.00%, unknown if currently breastfeeding. General appearance: alert, cooperative and no distress Affect: AAO x 3 Lungs: CTAB CV: RRR Abdomen: soft and fundus -2/u Lochia: moderated Rubra GU: normal GI: normal Extremities: slight edema of both ankles - advised to elevate same and advised that swelling sometimes increases after delivery around D4 PP.   Disposition: 01-Home or Self Care  Discharge Orders    Future Orders Please Complete By Expires   OB RESULT CONSOLE Group B Strep      Comments:   This external order was created through the  Results Console.   OB RESULTS CONSOLE GC/Chlamydia      Comments:   This external order was created through the Results Console.   OB RESULTS CONSOLE RPR      Comments:   This external order was created through the Results Console.   OB RESULTS CONSOLE HIV antibody      Comments:   This external order was created through the Results Console.   OB RESULTS CONSOLE Hepatitis B surface antigen      Comments:   This external order was created through the Results Console.   OB RESULTS CONSOLE ABO/Rh      Comments:   This external order was created through the Results Console.   OB RESULTS CONSOLE Antibody Screen      Comments:   This external order was created through the Results Console.     Medication List  As of 02/21/2012  8:38 AM   ASK your doctor about these medications         acetaminophen 500 MG tablet   Commonly known as: TYLENOL   Take 500 mg by mouth every 6 (six) hours as needed. For pain.      ferrous sulfate 325 (65 FE) MG tablet   Take 325 mg by mouth daily with breakfast.      prenatal multivitamin Tabs   Take 1 tablet by mouth daily.           Follow-up Information    Follow up with [facility] in 6 weeks.       NSVD, Viable Female infant " Phoenix"  With HS  Signed: Daquon Greenleaf, CNM. 02/21/2012, 8:38 AM

## 2012-03-20 ENCOUNTER — Telehealth: Payer: Self-pay

## 2012-03-20 NOTE — Telephone Encounter (Signed)
PC from dentist office notifying of dental infection.  Pt will be given Amoxicillin 500mg  1 every 6 hours x 10 days. #42.  Pt is BF.  ld

## 2012-03-21 ENCOUNTER — Encounter: Payer: Self-pay | Admitting: Obstetrics and Gynecology

## 2012-03-21 ENCOUNTER — Ambulatory Visit (INDEPENDENT_AMBULATORY_CARE_PROVIDER_SITE_OTHER): Payer: BC Managed Care – PPO | Admitting: Obstetrics and Gynecology

## 2012-03-21 DIAGNOSIS — N898 Other specified noninflammatory disorders of vagina: Secondary | ICD-10-CM

## 2012-03-21 NOTE — Progress Notes (Signed)
S: comfortable      Bleeding little     breastfeeding O VSS     abd soft, nontender     Diastasis recti 1 finger breath     Normal hair distrubition mons pubis,      EGBUS and perineum WNL, good vaginal tone cerix LTC, no cervical motion tenderness, No adnexal masses or tenderness uterus firm small A normal involution    Lactating    6 weeks PP P GC/CHL to lab    f/o up annual and prn    F/o up IUD I week discussed no intercourse prior to insertion and risks bleeding, perforation, infection, motrin prior to appt. Lavera Guise, CNM

## 2012-03-21 NOTE — Progress Notes (Signed)
Wanda Hammond  is 6 weeks postpartum following a spontaneous vaginal delivery at 31 gestational weeks Date: 02/19/2012 female baby named Fenix delivered by [facility].  Breastfeeding: yes Bottlefeeding:  no  Post-partum blues / depression:  no  EPDS score: 5  History of abnormal Pap:  no  Last Pap: Date  08/29/2011 Gestational diabetes:  no  Contraception:  Desires IUD  Normal urinary function:  yes Normal GI function:  yes Returning to work:  yes

## 2012-03-22 LAB — GC/CHLAMYDIA PROBE AMP, GENITAL: GC Probe Amp, Genital: NEGATIVE

## 2014-04-14 ENCOUNTER — Encounter: Payer: Self-pay | Admitting: Obstetrics and Gynecology

## 2014-12-03 ENCOUNTER — Ambulatory Visit (INDEPENDENT_AMBULATORY_CARE_PROVIDER_SITE_OTHER): Payer: 59 | Admitting: Physician Assistant

## 2014-12-03 VITALS — BP 106/58 | HR 109 | Temp 98.1°F | Resp 14 | Ht 64.5 in | Wt 175.4 lb

## 2014-12-03 DIAGNOSIS — L743 Miliaria, unspecified: Secondary | ICD-10-CM | POA: Diagnosis not present

## 2014-12-03 DIAGNOSIS — L559 Sunburn, unspecified: Secondary | ICD-10-CM

## 2014-12-03 MED ORDER — TRIAMCINOLONE ACETONIDE 0.1 % EX CREA: 1.0000 "application " | TOPICAL_CREAM | Freq: Two times a day (BID) | CUTANEOUS | Status: DC

## 2014-12-03 NOTE — Patient Instructions (Addendum)
Mix triamcinolone cream with lubriderm and apply thin layer to face and back twice a day over next 2-3 days. Do not place over eyes. Hot showers may dry you out and make worse. Stay cool as best you can. Wear cotton clothes. Use rough washcloth in shower and wipe on face and chest to open sweat glands. Ibuprofen 800 mg three times a day for next 72 hours. Return if not getting better in 4-5 days.

## 2014-12-03 NOTE — Progress Notes (Signed)
[facility] [address], [place] [address] [phone]  Date:  12/03/2014   Name:  Wanda Hammond   DOB:  12-Jan-1986   MRN:  [id number]  PCP:  Esmeralda Arthur, MD    Chief Complaint: Rash and Sunburn   History of Present Illness:  This is a 29 y.o. female with PMH anemia who is presenting with rash on face and chest and sunburn on back. Rash on face and chest appears as many small bumps. No rash on back but is red and extremely pruritic. [place] days ago. Rash and pruritus started 24 hours ago. She has tried anti-itch cream and Ice packs and both not helpful. The only thing that has helped is a scalding hot shower. She is allergic to aloe vera. She states when she was younger she had problems with heat rash. Her pediatrician told her she "doesn't sweat well". She was prescribed a steroid cream that she mixed with lubriderm and that was helpful for her. She denies fever, chills, n/v/d.  Review of Systems:  Review of Systems See HPI  Patient Active Problem List   Diagnosis Date Noted  . Anemia 01/02/2012  . Irregular periods/menstrual cycles 09/23/2011  . Family history of Downs syndrome 09/23/2011    Prior to Admission medications   Medication Sig Start Date End Date Taking? Authorizing Provider  acetaminophen (TYLENOL) 500 MG tablet Take 500 mg by mouth every 6 (six) hours as needed. For pain.    Historical Provider, MD  ferrous sulfate 325 (65 FE) MG tablet Take 325 mg by mouth daily with breakfast.     Historical Provider, MD    Allergies  Allergen Reactions  . Latex Itching and Rash    History reviewed. No pertinent past surgical history.  History  Substance Use Topics  . Smoking status: Never Smoker   . Smokeless tobacco: Never Used  . Alcohol Use: No    Family History  Problem Relation Age of Onset  . Anesthesia problems Neg Hx   . Diabetes Maternal Grandmother   . Down syndrome Brother   . Diabetes Mother   .  Hypertension Mother   . Cancer Father     Medication list has been reviewed and updated.  Physical Examination:  Physical Exam  Constitutional: She is oriented to person, place, and time. She appears well-developed and well-nourished. No distress.  HENT:  Head: Normocephalic and atraumatic.  Right Ear: Hearing normal.  Left Ear: Hearing normal.  Nose: Nose normal.  Eyes: Conjunctivae and lids are normal. Right eye exhibits no discharge. Left eye exhibits no discharge. No scleral icterus.  Pulmonary/Chest: Effort normal. No respiratory distress.  Musculoskeletal: Normal range of motion.  Neurological: She is alert and oriented to person, place, and time.  Skin: Skin is warm, dry and intact.  Numerous small flesh colored papules over face and upper chest. Upper back with erythema, right side of back worse than left. No blistering or desquamation  Psychiatric: She has a normal mood and affect. Her speech is normal and behavior is normal. Thought content normal.   BP 106/58 mmHg  Pulse 109  Temp(Src) 98.1 F (36.7 C) (Oral)  Resp 14  Ht 5' 4.5" (1.638 m)  Wt 175 lb 6.4 oz (79.561 kg)  BMI 29.65 kg/m2  SpO2 96%  LMP 12/02/2014  Assessment and Plan:  1. Miliaria 2. Sunburn  Gave triamcinolone to mix with lubriderm since this has been helpful in the  past for her. She will apply thin layer to affected areas, not eyes, for the next 3 days. Benadryl for itching. Ibuprofen 800 mig TID for pain/inflammation. Counseled on hydration. Return if not getting better in 4-5 days. - triamcinolone cream (KENALOG) 0.1 %; Apply 1 application topically 2 (two) times daily.  Dispense: 30 g; Refill: 0  [place] Miners. Dyke Brackett, MHS [facility]  12/03/2014

## 2015-01-11 ENCOUNTER — Ambulatory Visit (INDEPENDENT_AMBULATORY_CARE_PROVIDER_SITE_OTHER): Payer: 59 | Admitting: Family Medicine

## 2015-01-11 VITALS — BP 130/64 | HR 60 | Temp 97.9°F | Resp 16 | Ht 66.0 in | Wt 177.0 lb

## 2015-01-11 DIAGNOSIS — H6991 Unspecified Eustachian tube disorder, right ear: Secondary | ICD-10-CM

## 2015-01-11 DIAGNOSIS — J0100 Acute maxillary sinusitis, unspecified: Secondary | ICD-10-CM

## 2015-01-11 DIAGNOSIS — H6981 Other specified disorders of Eustachian tube, right ear: Secondary | ICD-10-CM | POA: Diagnosis not present

## 2015-01-11 MED ORDER — PSEUDOEPHEDRINE HCL ER 120 MG PO TB12: 120.0000 mg | ORAL_TABLET | Freq: Two times a day (BID) | ORAL | Status: DC

## 2015-01-11 MED ORDER — IPRATROPIUM BROMIDE 0.03 % NA SOLN: 2.0000 | Freq: Two times a day (BID) | NASAL | Status: DC

## 2015-01-11 MED ORDER — AMOXICILLIN 500 MG PO TABS: 500.0000 mg | ORAL_TABLET | Freq: Two times a day (BID) | ORAL | Status: DC

## 2015-01-11 NOTE — Patient Instructions (Signed)

## 2015-01-11 NOTE — Progress Notes (Signed)
Subjective:    Patient ID: Wanda Hammond, female    DOB: [birth year], 29 y.o.   MRN: [id number] This chart was scribed for Nilda Simmer, MD by Littie Deeds, Medical Scribe. This patient was seen in Room 11 and the patient's care was started at 1:22 PM.   01/11/2015  Sore Throat   HPI  HPI Comments: Wanda Hammond is a 29 y.o. female who [facility] complaining of gradual onset, intermittent, waxing and waning sore throat that started 6 days ago. Patient reports having associated cough, congestion, rhinorrhea, headache, and hearing loss in her right ear ("muffled hearing"). She has tried Robitussin but without relief. Patient denies fever, chills, diaphoresis, ear pain, ear drainage, nausea, vomiting, diarrhea, and sneezing. She has not been on any recent antibiotics. She states she is able to tolerate amoxicillin and Sudafed fine.  Has used Robitussin.  Patient works as a Manufacturing systems engineer.  Review of Systems  Constitutional: Negative for fever, chills, diaphoresis and fatigue.  HENT: Positive for congestion, hearing loss, rhinorrhea, sore throat and voice change. Negative for ear discharge, ear pain, sneezing and trouble swallowing.   Respiratory: Positive for cough.   Gastrointestinal: Negative for nausea, vomiting, abdominal pain and diarrhea.  Skin: Negative for rash.  Neurological: Positive for headaches.    Past Medical History  Diagnosis Date  . No pertinent past medical history   . H/O varicella   . NSVD (normal spontaneous vaginal delivery) 02/19/2012  . Laceration of vaginal wall or sulcus without perineal laceration during delivery 02/19/2012    2nd degree vaginal laceration; just left of pt's ML.  Repaired w/ 3 interrupted stitches   History reviewed. No pertinent past surgical history. Allergies  Allergen Reactions  . Latex Itching and Rash   Current Outpatient Prescriptions  Medication Sig Dispense Refill  . acetaminophen  (TYLENOL) 500 MG tablet Take 500 mg by mouth every 6 (six) hours as needed. For pain.    Marland Kitchen amoxicillin (AMOXIL) 500 MG tablet Take 1 tablet (500 mg total) by mouth 2 (two) times daily. 40 tablet 0  . ferrous sulfate 325 (65 FE) MG tablet Take 325 mg by mouth daily with breakfast.     . ipratropium (ATROVENT) 0.03 % nasal spray Place 2 sprays into the nose 2 (two) times daily. 30 mL 0  . pseudoephedrine (SUDAFED) 120 MG 12 hr tablet Take 1 tablet (120 mg total) by mouth 2 (two) times daily. 14 tablet 0   No current facility-administered medications for this visit.       Objective:    BP 130/64 mmHg  Pulse 60  Temp(Src) 97.9 F (36.6 C)  Resp 16  Ht  (1.676 m)  Wt 177 lb (80.287 kg)  BMI 28.58 kg/m2  SpO2   LMP 12/22/2014 Physical Exam  Constitutional: She is oriented to person, place, and time. She appears well-developed and well-nourished. No distress.  HENT:  Head: Normocephalic and atraumatic.  Right Ear: External ear and ear canal normal. Tympanic membrane is erythematous. Tympanic membrane is not perforated, not retracted and not bulging. Tympanic membrane mobility is normal.  Left Ear: External ear and ear canal normal. Tympanic membrane is not perforated, not erythematous, not retracted and not bulging. Tympanic membrane mobility is normal.  Nose: Mucosal edema and rhinorrhea present. Right sinus exhibits no maxillary sinus tenderness and no frontal sinus tenderness. Left sinus exhibits no maxillary sinus tenderness and no frontal sinus tenderness.  Mouth/Throat: Oropharynx is clear and  moist and mucous membranes are normal. No oropharyngeal exudate.  Mild erythema of the right TM.  Eyes: Pupils are equal, round, and reactive to light.  Neck: Neck supple.  Cardiovascular: Normal rate, regular rhythm and normal heart sounds.   No murmur heard. Pulmonary/Chest: Effort normal and breath sounds normal. No respiratory distress. She has no wheezes. She has no rales.    Musculoskeletal: She exhibits no edema.  Neurological: She is alert and oriented to person, place, and time. No cranial nerve deficit.  Skin: Skin is warm and dry. No rash noted. She is not diaphoretic.  Psychiatric: She has a normal mood and affect. Her behavior is normal.  Vitals reviewed.       Assessment & Plan:   1. Acute maxillary sinusitis, recurrence not specified   2. Eustachian tube dysfunction, right     -New. -Rx for Amoxicillin, Sudafed, Atrovent nasal spray.   Meds ordered this encounter  Medications  . amoxicillin (AMOXIL) 500 MG tablet    Sig: Take 1 tablet (500 mg total) by mouth 2 (two) times daily.    Dispense:  40 tablet    Refill:  0  . pseudoephedrine (SUDAFED) 120 MG 12 hr tablet    Sig: Take 1 tablet (120 mg total) by mouth 2 (two) times daily.    Dispense:  14 tablet    Refill:  0  . ipratropium (ATROVENT) 0.03 % nasal spray    Sig: Place 2 sprays into the nose 2 (two) times daily.    Dispense:  30 mL    Refill:  0    No Follow-up on file.   I personally performed the services described in this documentation, which was scribed in my presence. The recorded information has been reviewed and considered.  Laurelyn Terrero Paulita Fujita, M.D. [facility] [address] [place], [place]  [address] [phone] phone [phone] fax

## 2015-11-24 LAB — OB RESULTS CONSOLE GC/CHLAMYDIA
Chlamydia: NEGATIVE
Gonorrhea: NEGATIVE

## 2015-11-30 DIAGNOSIS — Z91040 Latex allergy status: Secondary | ICD-10-CM | POA: Insufficient documentation

## 2015-12-08 DIAGNOSIS — R87610 Atypical squamous cells of undetermined significance on cytologic smear of cervix (ASC-US): Secondary | ICD-10-CM | POA: Insufficient documentation

## 2016-05-04 LAB — OB RESULTS CONSOLE HIV ANTIBODY (ROUTINE TESTING): HIV: NONREACTIVE

## 2016-05-04 LAB — OB RESULTS CONSOLE RPR: RPR: NONREACTIVE

## 2016-06-13 NOTE — L&D Delivery Note (Signed)
Delivery Note At  a viable female was delivered via  (Presentation:vtx ;OA  ).  APGAR:9 ,9 ; weight pending  .   Placenta status:complete , . 3V Cord:  with the following complications:None .  Anesthesia:  Epideral Episiotomy:  None Lacerations:  2nd Suture Repair: 2.0 vicryl Est. Blood Loss (mL):  100ml  Mom to postpartum.  Baby to Couplet care / Skin to Skin.  Wanda Hammond 07/10/2016, 6:24 PM

## 2016-06-23 LAB — OB RESULTS CONSOLE GBS: STREP GROUP B AG: NEGATIVE

## 2016-07-10 ENCOUNTER — Inpatient Hospital Stay (HOSPITAL_COMMUNITY): Payer: 59 | Admitting: Anesthesiology

## 2016-07-10 ENCOUNTER — Inpatient Hospital Stay (HOSPITAL_COMMUNITY)
Admission: AD | Admit: 2016-07-10 | Discharge: 2016-07-10 | Disposition: A | Discharge status: Home / Self Care | Payer: 59 | Source: Ambulatory Visit | Attending: Obstetrics and Gynecology | Admitting: Obstetrics and Gynecology

## 2016-07-10 ENCOUNTER — Inpatient Hospital Stay (HOSPITAL_COMMUNITY)
Admission: AD | Admit: 2016-07-10 | Discharge: 2016-07-11 | DRG: 775 | Disposition: A | Discharge status: Home / Self Care | Payer: 59 | Source: Ambulatory Visit | Attending: Obstetrics and Gynecology | Admitting: Obstetrics and Gynecology

## 2016-07-10 ENCOUNTER — Encounter (HOSPITAL_COMMUNITY): Payer: Self-pay | Admitting: *Deleted

## 2016-07-10 ENCOUNTER — Encounter (HOSPITAL_COMMUNITY): Payer: Self-pay

## 2016-07-10 DIAGNOSIS — Z8249 Family history of ischemic heart disease and other diseases of the circulatory system: Secondary | ICD-10-CM

## 2016-07-10 DIAGNOSIS — Z3A40 40 weeks gestation of pregnancy: Secondary | ICD-10-CM

## 2016-07-10 DIAGNOSIS — Z3493 Encounter for supervision of normal pregnancy, unspecified, third trimester: Secondary | ICD-10-CM | POA: Diagnosis present

## 2016-07-10 DIAGNOSIS — Z833 Family history of diabetes mellitus: Secondary | ICD-10-CM

## 2016-07-10 LAB — CBC
HEMATOCRIT: 33.2 % — AB (ref 36.0–46.0)
HEMOGLOBIN: 11.2 g/dL — AB (ref 12.0–15.0)
MCH: 27.7 pg (ref 26.0–34.0)
MCHC: 33.7 g/dL (ref 30.0–36.0)
MCV: 82.2 fL (ref 78.0–100.0)
Platelets: 201 10*3/uL (ref 150–400)
RBC: 4.04 MIL/uL (ref 3.87–5.11)
RDW: 15.2 % (ref 11.5–15.5)
WBC: 9.9 10*3/uL (ref 4.0–10.5)

## 2016-07-10 LAB — TYPE AND SCREEN
ABO/RH(D): A POS
ANTIBODY SCREEN: NEGATIVE

## 2016-07-10 LAB — ABO/RH: ABO/RH(D): A POS

## 2016-07-10 MED ORDER — FENTANYL 2.5 MCG/ML BUPIVACAINE 1/10 % EPIDURAL INFUSION (WH - ANES): 14.0000 mL/h | INTRAMUSCULAR | Status: DC | PRN

## 2016-07-10 MED ORDER — ONDANSETRON HCL 4 MG PO TABS: 4.0000 mg | ORAL_TABLET | ORAL | Status: DC | PRN

## 2016-07-10 MED ORDER — ZOLPIDEM TARTRATE 5 MG PO TABS: 5.0000 mg | ORAL_TABLET | Freq: Every evening | ORAL | Status: DC | PRN

## 2016-07-10 MED ORDER — EPHEDRINE 5 MG/ML INJ: 10.0000 mg | INTRAVENOUS | Status: DC | PRN

## 2016-07-10 MED ORDER — LACTATED RINGERS IV SOLN: 500.0000 mL | Freq: Once | INTRAVENOUS | Status: DC

## 2016-07-10 MED ORDER — DIPHENHYDRAMINE HCL 25 MG PO CAPS: 25.0000 mg | ORAL_CAPSULE | Freq: Four times a day (QID) | ORAL | Status: DC | PRN

## 2016-07-10 MED ORDER — ACETAMINOPHEN 325 MG PO TABS: 650.0000 mg | ORAL_TABLET | ORAL | Status: DC | PRN

## 2016-07-10 MED ORDER — LIDOCAINE HCL (PF) 1 % IJ SOLN
30.0000 mL | INTRAMUSCULAR | Status: DC | PRN
  Filled 2016-07-10: qty 30

## 2016-07-10 MED ORDER — ONDANSETRON HCL 4 MG/2ML IJ SOLN: 4.0000 mg | Freq: Four times a day (QID) | INTRAMUSCULAR | Status: DC | PRN

## 2016-07-10 MED ORDER — DIBUCAINE 1 % RE OINT: 1.0000 "application " | TOPICAL_OINTMENT | RECTAL | Status: DC | PRN

## 2016-07-10 MED ORDER — PHENYLEPHRINE 40 MCG/ML (10ML) SYRINGE FOR IV PUSH (FOR BLOOD PRESSURE SUPPORT): 80.0000 ug | PREFILLED_SYRINGE | INTRAVENOUS | Status: DC | PRN

## 2016-07-10 MED ORDER — COCONUT OIL OIL: 1.0000 "application " | TOPICAL_OIL | Status: DC | PRN

## 2016-07-10 MED ORDER — PRENATAL MULTIVITAMIN CH
1.0000 | ORAL_TABLET | Freq: Every day | ORAL | Status: DC
  Filled 2016-07-10: qty 1

## 2016-07-10 MED ORDER — IBUPROFEN 600 MG PO TABS
600.0000 mg | ORAL_TABLET | Freq: Four times a day (QID) | ORAL | Status: DC
  Administered 2016-07-11 (×3): 600 mg via ORAL
  Filled 2016-07-10 (×4): qty 1

## 2016-07-10 MED ORDER — SIMETHICONE 80 MG PO CHEW
80.0000 mg | CHEWABLE_TABLET | ORAL | Status: DC | PRN
Start: 2016-07-10 — End: 2016-07-11

## 2016-07-10 MED ORDER — BENZOCAINE-MENTHOL 20-0.5 % EX AERO
1.0000 "application " | INHALATION_SPRAY | CUTANEOUS | Status: DC | PRN
  Filled 2016-07-10: qty 56

## 2016-07-10 MED ORDER — PHENYLEPHRINE 40 MCG/ML (10ML) SYRINGE FOR IV PUSH (FOR BLOOD PRESSURE SUPPORT)
80.0000 ug | PREFILLED_SYRINGE | INTRAVENOUS | Status: DC | PRN
  Filled 2016-07-10: qty 5

## 2016-07-10 MED ORDER — SENNOSIDES-DOCUSATE SODIUM 8.6-50 MG PO TABS
2.0000 | ORAL_TABLET | ORAL | Status: DC
  Administered 2016-07-11: 2 via ORAL
  Filled 2016-07-10: qty 2

## 2016-07-10 MED ORDER — SOD CITRATE-CITRIC ACID 500-334 MG/5ML PO SOLN: 30.0000 mL | ORAL | Status: DC | PRN

## 2016-07-10 MED ORDER — OXYCODONE-ACETAMINOPHEN 5-325 MG PO TABS: 2.0000 | ORAL_TABLET | ORAL | Status: DC | PRN

## 2016-07-10 MED ORDER — DIPHENHYDRAMINE HCL 50 MG/ML IJ SOLN: 12.5000 mg | INTRAMUSCULAR | Status: DC | PRN

## 2016-07-10 MED ORDER — LACTATED RINGERS IV SOLN
INTRAVENOUS | Status: DC
  Administered 2016-07-10: 12:00:00 via INTRAVENOUS

## 2016-07-10 MED ORDER — OXYCODONE-ACETAMINOPHEN 5-325 MG PO TABS: 1.0000 | ORAL_TABLET | ORAL | Status: DC | PRN

## 2016-07-10 MED ORDER — EPHEDRINE 5 MG/ML INJ
10.0000 mg | INTRAVENOUS | Status: DC | PRN
  Filled 2016-07-10: qty 4

## 2016-07-10 MED ORDER — TETANUS-DIPHTH-ACELL PERTUSSIS 5-2.5-18.5 LF-MCG/0.5 IM SUSP: 0.5000 mL | Freq: Once | INTRAMUSCULAR | Status: DC

## 2016-07-10 MED ORDER — LACTATED RINGERS IV SOLN: 500.0000 mL | INTRAVENOUS | Status: DC | PRN

## 2016-07-10 MED ORDER — ONDANSETRON HCL 4 MG/2ML IJ SOLN: 4.0000 mg | INTRAMUSCULAR | Status: DC | PRN

## 2016-07-10 MED ORDER — LIDOCAINE HCL (PF) 1 % IJ SOLN
INTRAMUSCULAR | Status: DC | PRN
  Administered 2016-07-10: 4 mL via EPIDURAL

## 2016-07-10 MED ORDER — WITCH HAZEL-GLYCERIN EX PADS: 1.0000 "application " | MEDICATED_PAD | CUTANEOUS | Status: DC | PRN

## 2016-07-10 MED ORDER — PHENYLEPHRINE 40 MCG/ML (10ML) SYRINGE FOR IV PUSH (FOR BLOOD PRESSURE SUPPORT)
80.0000 ug | PREFILLED_SYRINGE | INTRAVENOUS | Status: DC | PRN
  Filled 2016-07-10: qty 5
  Filled 2016-07-10: qty 10

## 2016-07-10 MED ORDER — FENTANYL 2.5 MCG/ML BUPIVACAINE 1/10 % EPIDURAL INFUSION (WH - ANES)
14.0000 mL/h | INTRAMUSCULAR | Status: DC | PRN
  Administered 2016-07-10: 14 mL/h via EPIDURAL
  Filled 2016-07-10: qty 100

## 2016-07-10 MED ORDER — OXYTOCIN BOLUS FROM INFUSION
500.0000 mL | Freq: Once | INTRAVENOUS | Status: AC
  Administered 2016-07-10: 500 mL via INTRAVENOUS

## 2016-07-10 MED ORDER — OXYTOCIN 40 UNITS IN LACTATED RINGERS INFUSION - SIMPLE MED
2.5000 [IU]/h | INTRAVENOUS | Status: DC
  Filled 2016-07-10: qty 1000

## 2016-07-10 NOTE — Progress Notes (Signed)
Subjective: Pt comfortable with epidural.  Denies pressure  Objective: BP 108/73   Pulse 76   Temp 97.7 F (36.5 C)   Resp 18   SpO2 99%  No intake/output data recorded. No intake/output data recorded.  FHT: Category 1 UC:   regular, every 2-3 minutes SVE:   Dilation: 8 Effacement (%): 80 Station: -1 Exam by:: Jaxden Blyden  cnm IUPC explained to patient and inserted without difficulty  Assessment:  Pt is a G2P1001 at 40 weeks IUP in active labor slow progress Cat 1 strip  Plan: Pitocin augmentation Monitor progress  Kenney HousemanNancy Jean Voyd Groft CNM, MSN 07/10/2016, 4:18 PM

## 2016-07-10 NOTE — MAU Note (Signed)
Cramping since 2400. Some bloody show. Was FT on THurs. Ctxs getting stronger

## 2016-07-10 NOTE — Anesthesia Procedure Notes (Signed)
Epidural Patient location during procedure: OB Start time: 07/10/2016 12:48 PM End time: 07/10/2016 12:54 PM  Staffing Anesthesiologist: Shona SimpsonHOLLIS, Lavene Penagos D Performed: anesthesiologist   Preanesthetic Checklist Completed: patient identified, site marked, surgical consent, pre-op evaluation, timeout performed, IV checked, risks and benefits discussed and monitors and equipment checked  Epidural Patient position: sitting Prep: ChloraPrep Patient monitoring: heart rate, continuous pulse ox and blood pressure Approach: midline Location: L3-L4 Injection technique: LOR saline  Needle:  Needle type: Tuohy  Needle gauge: 17 G Needle length: 9 cm Catheter type: closed end flexible Catheter size: 20 Guage Test dose: negative and 1.5% lidocaine  Assessment Events: blood not aspirated, injection not painful, no injection resistance and no paresthesia  Additional Notes LOR @ 6  Patient identified. Risks/Benefits/Options discussed with patient including but not limited to bleeding, infection, nerve damage, paralysis, failed block, incomplete pain control, headache, blood pressure changes, nausea, vomiting, reactions to medications, itching and postpartum back pain. Confirmed with bedside nurse the patient's most recent platelet count. Confirmed with patient that they are not currently taking any anticoagulation, have any bleeding history or any family history of bleeding disorders. Patient expressed understanding and wished to proceed. All questions were answered. Sterile technique was used throughout the entire procedure. Please see nursing notes for vital signs. Test dose was given through epidural catheter and negative prior to continuing to dose epidural or start infusion. Warning signs of high block given to the patient including shortness of breath, tingling/numbness in hands, complete motor block, or any concerning symptoms with instructions to call for help. Patient was given instructions on  fall risk and not to get out of bed. All questions and concerns addressed with instructions to call with any issues or inadequate analgesia.    Reason for block:procedure for pain

## 2016-07-10 NOTE — Progress Notes (Signed)
Subjective: Pt is feeling rectal pressure but comfortable with epidural.  Objective: BP 103/70   Pulse 91   Temp (P) 98.2 F (36.8 C) (Oral)   Resp 18   SpO2 99%  No intake/output data recorded. No intake/output data recorded.  FHT: Category 1 UC:   regular, every 3 minutes SVE:   8/100/-1 BBOW AROM thick mec fluid   Assessment:  Pt is a 31 yo G2P1001 at 40 weeks in active labor Cat 1 strip Mec stained fluid  Plan: Anticipate SVD, special care at delivery of mec fluid.  Kenney HousemanNancy Jean Sharry Beining CNM, MSN 07/10/2016, 1:28 PM

## 2016-07-10 NOTE — H&P (Signed)
Wanda DesanctisMichaele Craven Hammond is a 31 y.o. female, G2P1001 at 40 weeks, presenting for active labor.  Pt states she has been contracting all night.  Fm+  Denies leakage of fluid.  Prenatal hx unremarkable.    Patient Active Problem List   Diagnosis Date Noted  . Normal labor 07/10/2016  . Anemia 01/02/2012  . Irregular periods/menstrual cycles 09/23/2011  . Family history of Downs syndrome 09/23/2011    History of present pregnancy: Patient entered care at 6.2 weeks.   EDC of 07/10/2016 was established by LMP.   Anatomy scan:  20.2 weeks, with normal findings and an posterior placenta.   Additional US evaluations: 05/04/2016 Completion of anatomy scan Significant prenatal events:  None   Last evaluation:  Last week  OB History    Gravida Para Term Preterm AB Living   2 1 1  0 0 1   SAB TAB Ectopic Multiple Live Births   0 0 0 0 1     Past Medical History:  Diagnosis Date  . H/O varicella   . Laceration of vaginal wall or sulcus without perineal laceration during delivery 02/19/2012   2nd degree vaginal laceration; just left of pt's ML.  Repaired w/ 3 interrupted stitches  . No pertinent past medical history   . NSVD (normal spontaneous vaginal delivery) 02/19/2012   History reviewed. No pertinent surgical history. Family History: family history includes Cancer in her father; Diabetes in her maternal grandmother and mother; Down syndrome in her brother; Hypertension in her mother. Social History:  reports that she has never smoked. She has never used smokeless tobacco. She reports that she does not drink alcohol or use drugs.   Prenatal Transfer Tool  Maternal Diabetes: No Genetic Screening: Normal Maternal Ultrasounds/Referrals: Declined Fetal Ultrasounds or other Referrals:  None Maternal Substance Abuse:  No Significant Maternal Medications:  None Significant Maternal Lab Results: None  TDAP Yes Flu Yes  ROS:  All ten systems reviewed and negative expect for as stated  above  Allergies  Allergen Reactions  . Latex Itching and Rash     Dilation: 4.5 Effacement (%): 70 Station: -2 Exam by:: [email or web address],rnc Pulse 105, temperature 98.4 F (36.9 C), temperature source Oral, resp. rate 18, SpO2 99 %, currently breastfeeding.  Chest clear Heart RRR without murmur Abd gravid, NT, FH appropriate Pelvic: Adequate  EFW 8# Ext: +2/+2 negative edema  FHR: Category 1 UCs:  3-4 in 10 minutes  Prenatal labs: ABO, Rh: --/--/A POS (01/28 1144) Antibody: PENDING (01/28 1144) Rubella:  Immune RPR: Nonreactive (11/22 0000)  HBsAg:Negative    HIV: Non-reactive (11/22 0000)  GBS: Negative (01/11 0000) Sickle cell/Hgb electrophoresis:  AA Pap:  ASCUS High risk HPV +, colpo repeat pp GC:  Neg Chlamydia:  Neg Genetic screenings:  Neg Glucola:  Neg Other:   Hgb 12.5 at NOB, 10.0 at 28 weeks       Assessment/Plan: IUP at 40 wks IUP in active labor Cat 1 strip  Plan: Admit to Birthing Suite per consult with Vernardo Routine [facility] orders Pain med/epidural prn   Henderson NewcomerNancy Jean ProtheroCNM, MSN 07/10/2016, 12:29 PM

## 2016-07-10 NOTE — Anesthesia Preprocedure Evaluation (Signed)
Anesthesia Evaluation  Patient identified by MRN, date of birth, ID band Patient awake    Reviewed: Allergy & Precautions, Patient's Chart, lab work & pertinent test results  Airway Mallampati: II       Dental  (+) Teeth Intact   Pulmonary neg pulmonary ROS,    breath sounds clear to auscultation       Cardiovascular negative cardio ROS   Rhythm:Regular Rate:Normal     Neuro/Psych negative neurological ROS  negative psych ROS   GI/Hepatic negative GI ROS, Neg liver ROS,   Endo/Other  negative endocrine ROS  Renal/GU negative Renal ROS  negative genitourinary   Musculoskeletal negative musculoskeletal ROS (+)   Abdominal   Peds negative pediatric ROS (+)  Hematology negative hematology ROS (+)   Anesthesia Other Findings   Reproductive/Obstetrics (+) Pregnancy                             Lab Results  Component Value Date   WBC 9.9 07/10/2016   HGB 11.2 (L) 07/10/2016   HCT 33.2 (L) 07/10/2016   MCV 82.2 07/10/2016   PLT 201 07/10/2016     Anesthesia Physical Anesthesia Plan  ASA: II  Anesthesia Plan: Epidural   Post-op Pain Management:    Induction:   Airway Management Planned:   Additional Equipment:   Intra-op Plan:   Post-operative Plan:   Informed Consent: I have reviewed the patients History and Physical, chart, labs and discussed the procedure including the risks, benefits and alternatives for the proposed anesthesia with the patient or authorized representative who has indicated his/her understanding and acceptance.     Plan Discussed with: CRNA  Anesthesia Plan Comments:         Anesthesia Quick Evaluation

## 2016-07-10 NOTE — MAU Note (Signed)
Pt states she was here during the night for contractions. Pt states the contractions have been getting stronger and closer together since she left the hospital. Pt states she has had a little bloody show. Pt denies leaking of fluid. Pt states baby is moving normally.

## 2016-07-11 LAB — CBC
HCT: 27.6 % — ABNORMAL LOW (ref 36.0–46.0)
Hemoglobin: 9.6 g/dL — ABNORMAL LOW (ref 12.0–15.0)
MCH: 28.2 pg (ref 26.0–34.0)
MCHC: 34.8 g/dL (ref 30.0–36.0)
MCV: 81.2 fL (ref 78.0–100.0)
PLATELETS: 170 10*3/uL (ref 150–400)
RBC: 3.4 MIL/uL — AB (ref 3.87–5.11)
RDW: 15.4 % (ref 11.5–15.5)
WBC: 16.2 10*3/uL — AB (ref 4.0–10.5)

## 2016-07-11 LAB — RPR: RPR Ser Ql: NONREACTIVE

## 2016-07-11 MED ORDER — IBUPROFEN 600 MG PO TABS: 600.0000 mg | ORAL_TABLET | Freq: Four times a day (QID) | ORAL | 0 refills | Status: DC

## 2016-07-11 NOTE — Progress Notes (Signed)
Post Partum Day 1 Subjective:  Well. Lochia are normal. Voiding, ambulating, tolerating normal diet. nursing going well.  Objective: Blood pressure 113/64, pulse 88, temperature 98 F (36.7 C), temperature source Axillary, resp. rate 18, height 5' 4.5" (1.638 m), weight 203 lb 6.4 oz (92.3 kg), SpO2 99 %, unknown if currently breastfeeding.  Physical Exam:  General: normal Lochia: appropriate Uterine Fundus: 0/1  firm non-tender  Extremities: No evidence of DVT seen on physical exam. Edema minimal     Recent Labs  07/10/16 1144 07/11/16 0534  HGB 11.2* 9.6*  HCT 33.2* 27.6*    Assessment/Plan: Normal Post-partum. Continue routine post-partum care. Anticipate discharge tomorrow    LOS: 1 day   Kianna Billet A MD 07/11/2016, 12:45 PM

## 2016-07-11 NOTE — Anesthesia Postprocedure Evaluation (Addendum)
Anesthesia Post Note  [facility]aele [facility]  Procedure(s) Performed: * No procedures listed *  Anesthesia Type: Epidural        Last Vitals:  Vitals:   07/10/16 2134 07/11/16 0103  BP: 127/78 113/64  Pulse: 88 88  Resp: 18 18  Temp: 36.3 C 36.7 C    Last Pain:  Vitals:   07/11/16 0539  TempSrc:   PainSc: 0-No pain   Pain Goal: Patients Stated Pain Goal: 0 (07/10/16 1112)               Cephus ShellingBURGER,LINDA

## 2016-07-11 NOTE — Lactation Note (Signed)
This note was copied from a baby's chart. Lactation Consultation Note experienced BF mom has a 584 yr old that she BF for 1 yr. Denies any difficulty. Mom states this baby is latching well, occasionally will hurt. Instructed to do chin tug. Mom states much better. Baby is wanting to stay on breast constantly mom states. Mom has baby on breast laying across abd. Face down on breast. Discussed different positions. Mom asked to show her football. Positioned baby in football. Baby latches well. Demonstrated chin tug. Heard baby swallow in less than a minute. Encouraged to assess breast before and after BF for transfer of colostrum.  Reviewed w/mom newborn behavior and feeding habits. encouraged STS, I&O.Mom encouraged to feed baby 8-12 times/24 hours and with feeding cues.  [facility] brochure given w/resources, support groups and LC services. Patient Name: Wanda Hammond ZOXWR'UToday's Date: 07/11/2016 Reason for consult: Initial assessment   Maternal Data Has patient been taught Hand Expression?: Yes Does the patient have breastfeeding experience prior to this delivery?: Yes  Feeding Feeding Type: Breast Fed Length of feed: 50 min  LATCH Score/Interventions Latch: Grasps breast easily, tongue down, lips flanged, rhythmical sucking.  Audible Swallowing: Spontaneous and intermittent Intervention(s): Skin to skin;Hand expression  Type of Nipple: Everted at rest and after stimulation  Comfort (Breast/Nipple): Soft / non-tender     Hold (Positioning): Assistance needed to correctly position infant at breast and maintain latch. Intervention(s): Breastfeeding basics reviewed;Support Pillows;Position options;Skin to skin  LATCH Score: 9  Lactation Tools Discussed/Used     Consult Status Consult Status: Follow-up Date: 07/12/16 Follow-up type: In-patient    [name], Yonas Bunda G 07/11/2016, 2:58 AM

## 2016-07-11 NOTE — Discharge Summary (Signed)
Obstetric Discharge Summary  Reason for Admission: onset of labor on 07/10/16 Prenatal Procedures: none Intrapartum Procedures: spontaneous vaginal delivery by Bernerd PhoNancy Prothero CNM on 07/10/16 Postpartum Procedures: none Complications-Operative and Postpartum: 2nd degree perineal laceration  Hemoglobin  Date Value Ref Range Status  07/11/2016 9.6 (L) 12.0 - 15.0 g/dL Final   HCT  Date Value Ref Range Status  07/11/2016 27.6 (L) 36.0 - 46.0 % Final    Discharge Diagnoses: Term Pregnancy-delivered  Physical exam:   General: normal Lochia: appropriate Uterine Fundus: 0/2 firm non-tender  Extremities: No evidence of DVT seen on physical exam. Edema minimal   Hospital course: uncomplicated  Date: 07/11/2016 Activity: unrestricted Diet: routine Medications: Ibuprofen Condition: stable  Breastfeeding:   Yes.   Contraception:  *Mirena, reviewed  Instructions: refer to practice specific booklet Discharge to: home   Newborn Data:   Baby female Name: Sander Radonova Breastfeeding  VHQION,GEXBMWIVARD,Canary Fister A MD 07/11/2016, 2:54 PM

## 2016-07-11 NOTE — Discharge Instructions (Signed)

## 2016-07-13 ENCOUNTER — Telehealth (HOSPITAL_COMMUNITY): Payer: Self-pay

## 2016-07-13 NOTE — Telephone Encounter (Signed)
Mother described cracking on both nipples. Pain with breast feeding.  Mother advised to use the nipple to nose latch on technique. Advised to hug infant closely with infants chin tucked in the lower part of the breast with  Nose slightly touching the breast. Advised to rotate positions frequently. Suggested that she call her OB for APNO . Mother was scheduled for an out patient visit on Feb 2 at 9 am.

## 2016-07-15 ENCOUNTER — Ambulatory Visit (HOSPITAL_COMMUNITY): Admission: RE | Admit: 2016-07-15 | Payer: 59 | Source: Ambulatory Visit

## 2016-07-15 ENCOUNTER — Ambulatory Visit: Payer: Self-pay

## 2016-07-15 NOTE — Lactation Note (Signed)
This note was copied from a baby's chart. Lactation Consult  Mother's reason for visit:  Latching issues, Baby falling asleep at breast Visit Type:  Outpatient Appointment Notes:  Mom c/o of cracked right nipple, having difficulty with latch Consult:  Initial Lactation Consultant:  Alfred LevinsGranger, Laurel Smeltz Ann  ________________________________________________________________________   Joan FloresBaby's Name:  Wanda Hammond Date of Birth:  07/10/2017 Pediatrician:  Dr. Neysa Bonitohristy [facility] Gender:  female Gestational Age: 6165w0d (At Birth) Birth Weight:  7 lb 1.2 oz (3210 g) Weight at Discharge:                          Date of Discharge:   There were no vitals filed for this visit. [facility] weight taken [facility]:  07/14/16  6 lb. 14.0 oz     Location:Pediatrician's office Weight today:  6 lb. 15.8 oz/3168 gm with clean diaper at 365 days old.  ________________________________________________________________________  Mother's Name: Wanda Hammond Type of delivery:  Vaginal, Spontaneous Delivery Breastfeeding Experience:  P2 - Exp 1 year Maternal Medical Conditions:  None reported Maternal Medications:  Motrin prn  ________________________________________________________________________  Breastfeeding History (Post Discharge)  Frequency of breastfeeding:  On demand, but at least every 3 hours Duration of feeding:  Between 10 minutes to an hour  Mom has pumped 4 times since baby's birth receiving 40-100 ml now from right breast. Pumping this breast due to soreness. Mom has Spectra C DEBP. Baby will occasionally get bottle of EBM when Mom taking shower.  Infant Intake and Output Assessment  Voids:  4-6 in 24 hrs.  Color:  Clear yellow Stools:  3-4 in 24 hrs.  Color:  Yellow/seedy  ________________________________________________________________________  Maternal Breast Assessment  Breast:  Full Nipple:  Flat, nipples become more erect with expressing milk  prior to latch. Both nipples have short shafts. Right nipple has cracking across top of nipple. Left nipple intact.  Pain level:  Right nipple, PS=10 with initial latch in cross cradle decreasing to 4. Changed position to football hold on right nipple PS 3-4 decreasing to 0. No nipple pain with nursing on left breast. Pain interventions:  All purpose nipple cream, EBM  _______________________________________________________________________ Feeding Assessment/Evaluation  Initial feeding assessment:  Infant's oral assessment:  Variance. Baby noted to have short labial frenulum. Short posterior lingual frenulum with restriction of upward mobility and side/side movement of tongue. Bowl shape with crying. 1st child had short anterior lingual frenulum requiring revision.   Positioning:  Cross cradle Left breast  LATCH documentation:  Latch:  2 = Grasps breast easily, tongue down, lips flanged, rhythmical sucking.  Audible swallowing:  2 = Spontaneous and intermittent  Type of nipple:  1 = Flat  Comfort (Breast/Nipple):  2 = Soft / non-tender, breast full as milk has come in.  Hold (Positioning):  2 = No assistance needed to correctly position infant at breast  LATCH score:  9  Both upper/lower lips will tuck and need adjusting. Baby appears to have deep latch after adjusting lips.   Pre-feed weight:  3168 g  (6 lb. 15.8 oz.) Post-feed weight:  3190 g (7 lb. 0.5 oz.) Amount transferred:  22 ml with nursing for 11 minutes on left breast  Additional Feeding Assessment -   Infant's oral assessment:  Variance  Positioning:  Cross cradle/football Right breast  LATCH documentation:  Latch:  2 = Grasps breast easily, tongue down, lips flanged, rhythmical sucking.  Audible swallowing:  2 =  Spontaneous and intermittent  Type of nipple:  1 = Flat  Comfort (Breast/Nipple):  0 = Engorged, cracked, bleeding, large blisters, severe discomfort. PS=10 with initial latch in cross cradle decreasing to  4, changed to football hold PS=3-4 decreasing to 0.   Hold (Positioning):  1 = Assistance needed to correctly position infant at breast and maintain latch  LATCH score:  6  Pre-feed weight:  3190 g  (7 lb. 0.5 oz.) Post-feed weight:  3220 g  Amount transferred:  30 ml with nursing for 15 minutes.  Baby re-latched to left breast for 10 minutes and transferred an additional 8 ml.  Total amount transferred:  60 ml  Discussed with Mom using nipple shield to help with latch on right breast due to sore nipples and nipple trauma. Mom did not want to use nipple shield at this time but will re-visit this tool if nipple pain/trauma does not continue to improve. Continue to use [facility] and EBM to help healing.  Advised Mom to pre-pump for 3-5 minutes thru 1st milk ejection to soften nipple/aerola to help baby obtain/sustain good depth with latch.  Advised to alternate positions each feeding. Keep baby nursing on 1st breast to empty well then offer 2nd breast. Alternate each feeding which breasts she starts baby BF from. If right nipple does not continue to heal, discuss with Peds for referral to oral specialist regarding evaluation of frenulum. Mom interested in evaluation. Discussed making OP f/u to re-visit use of nipple shield if right nipple does not continue to improve/heal.  Mom will call for OP f/u if needed and encouraged to come to support group.

## 2016-11-11 NOTE — Addendum Note (Signed)
Addendum  created 11/11/16 0941 by Manasi Dishon D, MD   Sign clinical note    

## 2017-06-06 ENCOUNTER — Other Ambulatory Visit: Payer: Self-pay

## 2017-06-06 ENCOUNTER — Emergency Department (HOSPITAL_COMMUNITY)
Admission: EM | Admit: 2017-06-06 | Discharge: 2017-06-06 | Disposition: A | Discharge status: Home / Self Care | Payer: 59 | Attending: Emergency Medicine | Admitting: Emergency Medicine

## 2017-06-06 ENCOUNTER — Encounter (HOSPITAL_COMMUNITY): Payer: Self-pay

## 2017-06-06 ENCOUNTER — Emergency Department (HOSPITAL_COMMUNITY): Payer: 59

## 2017-06-06 DIAGNOSIS — M79672 Pain in left foot: Secondary | ICD-10-CM | POA: Diagnosis present

## 2017-06-06 MED ORDER — ONDANSETRON 4 MG PO TBDP: 4.0000 mg | ORAL_TABLET | Freq: Once | ORAL | Status: DC

## 2017-06-06 NOTE — Progress Notes (Signed)
Orthopedic Tech Progress Note Patient Details:  Wanda Hammond 1986-03-30 [id number]  Ortho Devices Type of Ortho Device: Crutches, Postop shoe/boot Ortho Device/Splint Interventions: Application   Post Interventions Patient Tolerated: Well, Ambulated well Instructions Provided: Care of device, Adjustment of device, Poper ambulation with device   Saul FordyceJennifer C Ezabella Teska 06/06/2017, 2:26 PM

## 2017-06-06 NOTE — ED Triage Notes (Signed)
Pt arrived from home with c/o left foot and toe pain that started last night. Denies injury or trauma. Pt reports pain with ambulation denies pain currently.

## 2017-06-06 NOTE — ED Notes (Signed)
Patient transported to X-ray 

## 2017-06-06 NOTE — ED Provider Notes (Signed)
[facility]   CSN: [id number] Arrival date & time: 06/06/17  1045     History   Chief Complaint Chief Complaint  Patient presents with  . Toe Pain    HPI Wanda Hammond is a 31 y.o. female presents to the emergency department with a chief complaint of intermittent left foot pain that began without trauma or injury last night after a long car ride.  She characterizes the pain is sharp and worse when she puts pressure on the balls of her feet.  Improved with nonweightbearing.  She has taken ibuprofen for her pain with improvement.  No previous surgery or injury.  She denies left ankle or right foot pain.  No numbness or weakness.  No history of diabetes.  The history is provided by the patient. No language interpreter was used.    Past Medical History:  Diagnosis Date  . H/O varicella   . Laceration of vaginal wall or sulcus without perineal laceration during delivery 02/19/2012   2nd degree vaginal laceration; just left of pt's ML.  Repaired w/ 3 interrupted stitches  . No pertinent past medical history   . NSVD (normal spontaneous vaginal delivery) 02/19/2012    Patient Active Problem List   Diagnosis Date Noted  . Normal labor 07/10/2016  . SVD (spontaneous vaginal delivery) 07/10/2016  . Anemia 01/02/2012  . Irregular periods/menstrual cycles 09/23/2011  . Family history of Downs syndrome 09/23/2011    History reviewed. No pertinent surgical history.  OB History    Gravida Para Term Preterm AB Living   2 2 2  0 0 2   SAB TAB Ectopic Multiple Live Births   0 0 0 0 2       Home Medications    Prior to Admission medications   Not on File    Family History Family History  Problem Relation Age of Onset  . Diabetes Maternal Grandmother   . Down syndrome Brother   . Diabetes Mother   . Hypertension Mother   . Cancer Father   . Anesthesia problems Neg Hx     Social History Social History    Tobacco Use  . Smoking status: Never Smoker  . Smokeless tobacco: Never Used  Substance Use Topics  . Alcohol use: No  . Drug use: No     Allergies   Latex   Review of Systems Review of Systems  Constitutional: Negative for activity change.  Musculoskeletal: Positive for arthralgias, gait problem and myalgias. Negative for back pain.  Skin: Negative for rash and wound.  Neurological: Negative for weakness and numbness.     Physical Exam Updated Vital Signs BP 104/73   Pulse 78   Temp 98.3 F (36.8 C) (Oral)   Resp 16   Ht 5' 4.5" (1.638 m)   Wt 85.3 kg (188 lb)   SpO2 100%   BMI 31.77 kg/m   Physical Exam  Constitutional: No distress.  HENT:  Head: Normocephalic.  Eyes: Conjunctivae are normal.  Neck: Neck supple.  Cardiovascular: Normal rate and regular rhythm. Exam reveals no gallop and no friction rub.  No murmur heard. Pulmonary/Chest: Effort normal. No respiratory distress.  Abdominal: Soft. She exhibits no distension.  Musculoskeletal:  Tender to palpation on the plantar surface of the left foot, at the base of the second and third digits.  The second and third digits are nontender.  Independently moves all digits.  The digits are well perfused.  She is neurovascularly intact.  No overlying wounds, lesions, erythema, edema, or warmth.   Neurological: She is alert.  Skin: Skin is warm. No rash noted.  Psychiatric: Her behavior is normal.  Nursing note and vitals reviewed.    ED Treatments / Results  Labs (all labs ordered are listed, but only abnormal results are displayed) Labs Reviewed - No data to display  EKG  EKG Interpretation None       Radiology Dg Foot Complete Left  Result Date: 06/06/2017 CLINICAL DATA:  Plantar pain.  No known injury. EXAM: LEFT FOOT - COMPLETE 3+ VIEW COMPARISON:  None. FINDINGS: There is no evidence of fracture or dislocation. There is no evidence of arthropathy or other focal bone abnormality. Soft tissues  are unremarkable. IMPRESSION: Negative. Electronically Signed   By: Charlett NoseKevin  Dover M.D.   On: 06/06/2017 12:46    Procedures Procedures (including critical care time)  Medications Ordered in ED Medications - No data to display   Initial Impression / Assessment and Plan / ED Course  I have reviewed the triage vital signs and the nursing notes.  Pertinent labs & imaging results that were available during my care of the patient were reviewed by me and considered in my medical decision making (see chart for details).     31 year old female presenting with atraumatic left forefoot pain on the plantar surface.  X-ray is unremarkable for bone spurs, fracture, or other focal abnormalities.  Will place the patient in a postop shoe, with crutches, and RICE therapy.  Follow-up to podiatry if symptoms do not improve. VSS. NAD.  Strict return precautions given. The patient is safe for discharge at this time.  Final Clinical Impressions(s) / ED Diagnoses   Final diagnoses:  Acute pain of left foot    ED Discharge Orders    None       Barkley BoardsMcDonald, Harce Volden A, PA-C 06/06/17 Emilee [id number]    Yelverton, David, MD 06/08/17 1126

## 2017-06-06 NOTE — Discharge Instructions (Addendum)
Use the crutches as needed until you can put more weight on your left foot.  You can wear the postop shoe with socks so that it does not compress the forefoot.  Take 600 mg of ibuprofen with food or 650 mg of Tylenol once every 6 hours as needed for pain control.  When you are sitting and resting, please elevate the left leg and foot above the level of the heart.  You can apply ice for 15-20 minutes up to 3-4 times a day to help with pain.  If you develop new or worsening symptoms, including if the foot becomes red, hot, or swollen, please return to the emergency department for reevaluation.  If your pain does not start to improve in the next 5-7 days, please call Dr. Logan BoresEvans office to schedule follow-up appointment

## 2017-12-21 ENCOUNTER — Ambulatory Visit (INDEPENDENT_AMBULATORY_CARE_PROVIDER_SITE_OTHER): Payer: 59 | Admitting: Nurse Practitioner

## 2017-12-21 ENCOUNTER — Encounter: Payer: Self-pay | Admitting: Nurse Practitioner

## 2017-12-21 ENCOUNTER — Other Ambulatory Visit (HOSPITAL_COMMUNITY)
Admission: RE | Admit: 2017-12-21 | Discharge: 2017-12-21 | Disposition: A | Discharge status: Home / Self Care | Payer: 59 | Source: Ambulatory Visit | Attending: Nurse Practitioner | Admitting: Nurse Practitioner

## 2017-12-21 VITALS — BP 102/74 | HR 99 | Temp 98.3°F | Ht 64.5 in | Wt 199.0 lb

## 2017-12-21 DIAGNOSIS — N760 Acute vaginitis: Secondary | ICD-10-CM | POA: Insufficient documentation

## 2017-12-21 DIAGNOSIS — Z124 Encounter for screening for malignant neoplasm of cervix: Secondary | ICD-10-CM

## 2017-12-21 DIAGNOSIS — D649 Anemia, unspecified: Secondary | ICD-10-CM

## 2017-12-21 DIAGNOSIS — Z13220 Encounter for screening for lipoid disorders: Secondary | ICD-10-CM | POA: Diagnosis not present

## 2017-12-21 DIAGNOSIS — Z136 Encounter for screening for cardiovascular disorders: Secondary | ICD-10-CM

## 2017-12-21 DIAGNOSIS — B9689 Other specified bacterial agents as the cause of diseases classified elsewhere: Secondary | ICD-10-CM | POA: Diagnosis not present

## 2017-12-21 DIAGNOSIS — Z8742 Personal history of other diseases of the female genital tract: Secondary | ICD-10-CM | POA: Insufficient documentation

## 2017-12-21 DIAGNOSIS — Z0001 Encounter for general adult medical examination with abnormal findings: Secondary | ICD-10-CM

## 2017-12-21 LAB — CBC WITH DIFFERENTIAL/PLATELET
Basophils Absolute: 0 10*3/uL (ref 0.0–0.1)
Basophils Relative: 0.7 % (ref 0.0–3.0)
EOS ABS: 0.1 10*3/uL (ref 0.0–0.7)
Eosinophils Relative: 1.7 % (ref 0.0–5.0)
HEMATOCRIT: 40.6 % (ref 36.0–46.0)
Hemoglobin: 13.7 g/dL (ref 12.0–15.0)
LYMPHS PCT: 25.6 % (ref 12.0–46.0)
Lymphs Abs: 1.4 10*3/uL (ref 0.7–4.0)
MCHC: 33.8 g/dL (ref 30.0–36.0)
MCV: 90.5 fl (ref 78.0–100.0)
Monocytes Absolute: 0.8 10*3/uL (ref 0.1–1.0)
Monocytes Relative: 14.3 % — ABNORMAL HIGH (ref 3.0–12.0)
NEUTROS ABS: 3.2 10*3/uL (ref 1.4–7.7)
NEUTROS PCT: 57.7 % (ref 43.0–77.0)
PLATELETS: 270 10*3/uL (ref 150.0–400.0)
RBC: 4.49 Mil/uL (ref 3.87–5.11)
RDW: 13.8 % (ref 11.5–15.5)
WBC: 5.6 10*3/uL (ref 4.0–10.5)

## 2017-12-21 LAB — COMPREHENSIVE METABOLIC PANEL
ALT: 12 U/L (ref 0–35)
AST: 17 U/L (ref 0–37)
Albumin: 3.7 g/dL (ref 3.5–5.2)
Alkaline Phosphatase: 52 U/L (ref 39–117)
BILIRUBIN TOTAL: 0.4 mg/dL (ref 0.2–1.2)
BUN: 12 mg/dL (ref 6–23)
CO2: 27 mEq/L (ref 19–32)
CREATININE: 1.05 mg/dL (ref 0.40–1.20)
Calcium: 8.8 mg/dL (ref 8.4–10.5)
Chloride: 105 mEq/L (ref 96–112)
GFR: 77.71 mL/min (ref >60.00)
GLUCOSE: 89 mg/dL (ref 70–99)
Potassium: 3.9 mEq/L (ref 3.5–5.1)
Sodium: 138 mEq/L (ref 135–145)
Total Protein: 7.1 g/dL (ref 6.0–8.3)

## 2017-12-21 LAB — LIPID PANEL
Cholesterol: 152 mg/dL (ref 0–200)
HDL: 58.7 mg/dL (ref >39.00)
LDL Cholesterol: 81 mg/dL (ref 0–99)
NonHDL: 93.35
TRIGLYCERIDES: 61 mg/dL (ref 0.0–149.0)
Total CHOL/HDL Ratio: 3
VLDL: 12.2 mg/dL (ref 0.0–40.0)

## 2017-12-21 LAB — TSH: TSH: 2.03 u[IU]/mL (ref 0.35–4.50)

## 2017-12-21 NOTE — Progress Notes (Addendum)
Subjective:    Patient ID: Wanda Hammond, female    DOB: [birth year], 32 y.o.   MRN: [id number]  Patient presents today for complete physical  HPI  Denies any acute complains.  Immunizations: (TDAP, Hep C screen, Pneumovax, Influenza, zoster)  Health Maintenance  Topic Date Due  . Pap Smear  02/24/2006  . Flu Shot  01/11/2018  . Tetanus Vaccine  06/02/2026  . HIV Screening  Completed   Diet:regular.  Weight:  Wt Readings from Last 3 Encounters:  12/21/17 199 lb (90.3 kg)  06/06/17 188 lb (85.3 kg)  07/10/16 203 lb 6.4 oz (92.3 kg)   Exercise:none.  Fall Risk: Fall Risk  12/21/2017 01/11/2015  Falls in the past year? No No   Home Safety:hoem with husband and 2children.  Depression/Suicide: Depression screen [facility] 2/9 12/21/2017 01/11/2015 12/03/2014  Decreased Interest 0 0 0  Down, Depressed, Hopeless 0 0 0  PHQ - 2 Score 0 0 0   Vision:done annually, up to date.  Dental:done every 6months, up to date.  Medications and allergies reviewed with patient and updated if appropriate.  Patient Active Problem List   Diagnosis Date Noted  . Hx of abnormal cervical Papanicolaou smear 12/21/2017  . Anemia 01/02/2012  . Irregular periods/menstrual cycles 09/23/2011  . Family history of Downs syndrome 09/23/2011    Current Outpatient Medications on File Prior to Visit  Medication Sig Dispense Refill  . levonorgestrel (MIRENA, 52 MG,) 20 MCG/24HR IUD Mirena 20 mcg/24 hours (5 yrs) 52 mg intrauterine device  Take 1 insert by intrauterine route.     No current facility-administered medications on file prior to visit.     Past Medical History:  Diagnosis Date  . Chicken pox   . H/O varicella   . Laceration of vaginal wall or sulcus without perineal laceration during delivery 02/19/2012   2nd degree vaginal laceration; just left of pt's ML.  Repaired w/ 3 interrupted stitches  . No pertinent past medical history   . NSVD (normal spontaneous vaginal delivery)  02/19/2012  . UTI (urinary tract infection)     History reviewed. No pertinent surgical history.  Social History   Socioeconomic History  . Marital status: Married    Spouse name: Not on file  . Number of children: 1  . Years of education: Not on file  . Highest education level: Not on file  Occupational History  . Not on file  Social Needs  . Financial resource strain: Not on file  . Food insecurity:    Worry: Not on file    Inability: Not on file  . Transportation needs:    Medical: Not on file    Non-medical: Not on file  Tobacco Use  . Smoking status: Never Smoker  . Smokeless tobacco: Never Used  Substance and Sexual Activity  . Alcohol use: Yes    Comment: once in a while  . Drug use: No  . Sexual activity: Yes    Birth control/protection: IUD  Lifestyle  . Physical activity:    Days per week: Not on file    Minutes per session: Not on file  . Stress: Not on file  Relationships  . Social connections:    Talks on phone: Not on file    Gets together: Not on file    Attends religious service: Not on file    Active member of club or organization: Not on file    Attends meetings of clubs or organizations: Not on file  Relationship status: Not on file  Other Topics Concern  . Not on file  Social History Narrative  . Not on file    Family History  Problem Relation Age of Onset  . Diabetes Maternal Grandmother   . Down syndrome Brother   . Diabetes Mother   . Hypertension Mother   . Arthritis Mother   . Cancer Father        lung  . Alcohol abuse Father   . Asthma Father   . Hypertension Father   . Anesthesia problems Neg Hx         Review of Systems  Constitutional: Negative for fever, malaise/fatigue and weight loss.  HENT: Negative for congestion and sore throat.   Eyes:       Negative for visual changes  Respiratory: Negative for cough and shortness of breath.   Cardiovascular: Negative for chest pain, palpitations and leg swelling.    Gastrointestinal: Negative for blood in stool, constipation, diarrhea and heartburn.  Genitourinary: Negative for dysuria, frequency and urgency.  Musculoskeletal: Negative for falls, joint pain and myalgias.  Skin: Negative for rash.  Neurological: Negative for dizziness, sensory change and headaches.  Endo/Heme/Allergies: Does not bruise/bleed easily.  Psychiatric/Behavioral: Negative for depression, substance abuse and suicidal ideas. The patient is not nervous/anxious and does not have insomnia.     Objective:   Vitals:   12/21/17 0903  BP: 102/74  Pulse: 99  Temp: 98.3 F (36.8 C)  SpO2: 96%    Body mass index is 33.63 kg/m.   Physical Examination:  Physical Exam  Constitutional: She is oriented to person, place, and time. No distress.  HENT:  Right Ear: External ear normal.  Left Ear: External ear normal.  Nose: Nose normal.  Mouth/Throat: No oropharyngeal exudate.  Eyes: Pupils are equal, round, and reactive to light. Conjunctivae and EOM are normal. No scleral icterus.  Neck: Normal range of motion. Neck supple. No thyromegaly present.  Cardiovascular: Normal rate, regular rhythm, normal heart sounds and intact distal pulses.  Pulmonary/Chest: Effort normal and breath sounds normal. Right breast exhibits no inverted nipple, no mass, no nipple discharge, no skin change and no tenderness. Left breast exhibits no inverted nipple, no mass, no nipple discharge, no skin change and no tenderness. Breasts are symmetrical.  Abdominal: Soft. Bowel sounds are normal. She exhibits no distension. There is no tenderness. Hernia confirmed negative in the right inguinal area and confirmed negative in the left inguinal area.  Genitourinary: Rectum normal. There is no rash or tenderness on the right labia. There is tenderness on the left labia. There is no rash on the left labia. Cervix exhibits discharge and friability. Cervix exhibits no motion tenderness. Vaginal discharge found.   Genitourinary Comments: IUD string in place, Cervical transformation zone noted, yellow discharge with musty odor noted.  Musculoskeletal: Normal range of motion. She exhibits no edema or tenderness.  Lymphadenopathy:    She has no cervical adenopathy. No inguinal adenopathy noted on the right or left side.  Neurological: She is alert and oriented to person, place, and time.  Skin: Skin is warm and dry.  Psychiatric: She has a normal mood and affect. Her behavior is normal. Thought content normal.  Vitals reviewed.   ASSESSMENT and PLAN:  Sahira was seen today for establish care.  Diagnoses and all orders for this visit:  Encounter for preventative adult health care exam with abnormal findings -     Comprehensive metabolic panel -     TSH -  Lipid panel -     Cancel: Cytology - PAP  Encounter for lipid screening for cardiovascular disease -     Lipid panel  Encounter for Papanicolaou smear for cervical cancer screening -     Cancel: Cytology - PAP -     Cytology - PAP -     Cytology - PAP  Anemia, unspecified type -     CBC w/Diff  Vaginosis -     Cytology - PAP -     Cytology - PAP -     metroNIDAZOLE (METROGEL VAGINAL) 0.75 % vaginal gel; Place 1 Applicatorful vaginally at bedtime.   No problem-specific Assessment & Plan notes found for this encounter.     Follow up: Return if symptoms worsen or fail to improve.  Alysia Pennaharlotte Kolette Vey, NP

## 2017-12-21 NOTE — Patient Instructions (Addendum)
Normal CBC, CMP, TSh and lipid panel. Pending PAP.  Health Maintenance, Female Adopting a healthy lifestyle and getting preventive care can go a long way to promote health and wellness. Talk with your health care provider about what schedule of regular examinations is right for you. This is a good chance for you to check in with your provider about disease prevention and staying healthy. In between checkups, there are plenty of things you can do on your own. Experts have done a lot of research about which lifestyle changes and preventive measures are most likely to keep you healthy. Ask your health care provider for more information. Weight and diet Eat a healthy diet  Be sure to include plenty of vegetables, fruits, low-fat dairy products, and lean protein.  Do not eat a lot of foods high in solid fats, added sugars, or salt.  Get regular exercise. This is one of the most important things you can do for your health. ? Most adults should exercise for at least 150 minutes each week. The exercise should increase your heart rate and make you sweat (moderate-intensity exercise). ? Most adults should also do strengthening exercises at least twice a week. This is in addition to the moderate-intensity exercise.  Maintain a healthy weight  Body mass index (BMI) is a measurement that can be used to identify possible weight problems. It estimates body fat based on height and weight. Your health care provider can help determine your BMI and help you achieve or maintain a healthy weight.  For females 28 years of age and older: ? A BMI below 18.5 is considered underweight. ? A BMI of 18.5 to 24.9 is normal. ? A BMI of 25 to 29.9 is considered overweight. ? A BMI of 30 and above is considered obese.  Watch levels of cholesterol and blood lipids  You should start having your blood tested for lipids and cholesterol at 33 years of age, then have this test every 5 years.  You may need to have your  cholesterol levels checked more often if: ? Your lipid or cholesterol levels are high. ? You are older than 33 years of age. ? You are at high risk for heart disease.  Cancer screening Lung Cancer  Lung cancer screening is recommended for adults 84-25 years old who are at high risk for lung cancer because of a history of smoking.  A yearly low-dose CT scan of the lungs is recommended for people who: ? Currently smoke. ? Have quit within the past 15 years. ? Have at least a 30-pack-year history of smoking. A pack year is smoking an average of one pack of cigarettes a day for 1 year.  Yearly screening should continue until it has been 15 years since you quit.  Yearly screening should stop if you develop a health problem that would prevent you from having lung cancer treatment.  Breast Cancer  Practice breast self-awareness. This means understanding how your breasts normally appear and feel.  It also means doing regular breast self-exams. Let your health care provider know about any changes, no matter how small.  If you are in your 20s or 30s, you should have a clinical breast exam (CBE) by a health care provider every 1-3 years as part of a regular health exam.  If you are 19 or older, have a CBE every year. Also consider having a breast X-ray (mammogram) every year.  If you have a family history of breast cancer, talk to your health care provider  about genetic screening.  If you are at high risk for breast cancer, talk to your health care provider about having an MRI and a mammogram every year.  Breast cancer gene (BRCA) assessment is recommended for women who have family members with BRCA-related cancers. BRCA-related cancers include: ? Breast. ? Ovarian. ? Tubal. ? Peritoneal cancers.  Results of the assessment will determine the need for genetic counseling and BRCA1 and BRCA2 testing.  Cervical Cancer Your health care provider may recommend that you be screened regularly  for cancer of the pelvic organs (ovaries, uterus, and vagina). This screening involves a pelvic examination, including checking for microscopic changes to the surface of your cervix (Pap test). You may be encouraged to have this screening done every 3 years, beginning at age 19.  For women ages 50-65, health care providers may recommend pelvic exams and Pap testing every 3 years, or they may recommend the Pap and pelvic exam, combined with testing for human papilloma virus (HPV), every 5 years. Some types of HPV increase your risk of cervical cancer. Testing for HPV may also be done on women of any age with unclear Pap test results.  Other health care providers may not recommend any screening for nonpregnant women who are considered low risk for pelvic cancer and who do not have symptoms. Ask your health care provider if a screening pelvic exam is right for you.  If you have had past treatment for cervical cancer or a condition that could lead to cancer, you need Pap tests and screening for cancer for at least 20 years after your treatment. If Pap tests have been discontinued, your risk factors (such as having a new sexual partner) need to be reassessed to determine if screening should resume. Some women have medical problems that increase the chance of getting cervical cancer. In these cases, your health care provider may recommend more frequent screening and Pap tests.  Colorectal Cancer  This type of cancer can be detected and often prevented.  Routine colorectal cancer screening usually begins at 33 years of age and continues through 33 years of age.  Your health care provider may recommend screening at an earlier age if you have risk factors for colon cancer.  Your health care provider may also recommend using home test kits to check for hidden blood in the stool.  A small camera at the end of a tube can be used to examine your colon directly (sigmoidoscopy or colonoscopy). This is done to  check for the earliest forms of colorectal cancer.  Routine screening usually begins at age 28.  Direct examination of the colon should be repeated every 5-10 years through 33 years of age. However, you may need to be screened more often if early forms of precancerous polyps or small growths are found.  Skin Cancer  Check your skin from head to toe regularly.  Tell your health care provider about any new moles or changes in moles, especially if there is a change in a mole's shape or color.  Also tell your health care provider if you have a mole that is larger than the size of a pencil eraser.  Always use sunscreen. Apply sunscreen liberally and repeatedly throughout the day.  Protect yourself by wearing long sleeves, pants, a wide-brimmed hat, and sunglasses whenever you are outside.  Heart disease, diabetes, and high blood pressure  High blood pressure causes heart disease and increases the risk of stroke. High blood pressure is more likely to develop in: ?  People who have blood pressure in the high end of the normal range (130-139/85-89 mm Hg). ? People who are overweight or obese. ? People who are African American.  If you are 18-39 years of age, have your blood pressure checked every 3-5 years. If you are 40 years of age or older, have your blood pressure checked every year. You should have your blood pressure measured twice-once when you are at a hospital or clinic, and once when you are not at a hospital or clinic. Record the average of the two measurements. To check your blood pressure when you are not at a hospital or clinic, you can use: ? An automated blood pressure machine at a pharmacy. ? A home blood pressure monitor.  If you are between 55 years and 79 years old, ask your health care provider if you should take aspirin to prevent strokes.  Have regular diabetes screenings. This involves taking a blood sample to check your fasting blood sugar level. ? If you are at a  normal weight and have a low risk for diabetes, have this test once every three years after 33 years of age. ? If you are overweight and have a high risk for diabetes, consider being tested at a younger age or more often. Preventing infection Hepatitis B  If you have a higher risk for hepatitis B, you should be screened for this virus. You are considered at high risk for hepatitis B if: ? You were born in a country where hepatitis B is common. Ask your health care provider which countries are considered high risk. ? Your parents were born in a high-risk country, and you have not been immunized against hepatitis B (hepatitis B vaccine). ? You have HIV or AIDS. ? You use needles to inject street drugs. ? You live with someone who has hepatitis B. ? You have had sex with someone who has hepatitis B. ? You get hemodialysis treatment. ? You take certain medicines for conditions, including cancer, organ transplantation, and autoimmune conditions.  Hepatitis C  Blood testing is recommended for: ? Everyone born from 1945 through 1965. ? Anyone with known risk factors for hepatitis C.  Sexually transmitted infections (STIs)  You should be screened for sexually transmitted infections (STIs) including gonorrhea and chlamydia if: ? You are sexually active and are younger than 33 years of age. ? You are older than 33 years of age and your health care provider tells you that you are at risk for this type of infection. ? Your sexual activity has changed since you were last screened and you are at an increased risk for chlamydia or gonorrhea. Ask your health care provider if you are at risk.  If you do not have HIV, but are at risk, it may be recommended that you take a prescription medicine daily to prevent HIV infection. This is called pre-exposure prophylaxis (PrEP). You are considered at risk if: ? You are sexually active and do not regularly use condoms or know the HIV status of your  partner(s). ? You take drugs by injection. ? You are sexually active with a partner who has HIV.  Talk with your health care provider about whether you are at high risk of being infected with HIV. If you choose to begin PrEP, you should first be tested for HIV. You should then be tested every 3 months for as long as you are taking PrEP. Pregnancy  If you are premenopausal and you may become pregnant, ask your health   care provider about preconception counseling.  If you may become pregnant, take 400 to 800 micrograms (mcg) of folic acid every day.  If you want to prevent pregnancy, talk to your health care provider about birth control (contraception). Osteoporosis and menopause  Osteoporosis is a disease in which the bones lose minerals and strength with aging. This can result in serious bone fractures. Your risk for osteoporosis can be identified using a bone density scan.  If you are 65 years of age or older, or if you are at risk for osteoporosis and fractures, ask your health care provider if you should be screened.  Ask your health care provider whether you should take a calcium or vitamin D supplement to lower your risk for osteoporosis.  Menopause may have certain physical symptoms and risks.  Hormone replacement therapy may reduce some of these symptoms and risks. Talk to your health care provider about whether hormone replacement therapy is right for you. Follow these instructions at home:  Schedule regular health, dental, and eye exams.  Stay current with your immunizations.  Do not use any tobacco products including cigarettes, chewing tobacco, or electronic cigarettes.  If you are pregnant, do not drink alcohol.  If you are breastfeeding, limit how much and how often you drink alcohol.  Limit alcohol intake to no more than 1 drink per day for nonpregnant women. One drink equals 12 ounces of beer, 5 ounces of wine, or 1 ounces of hard liquor.  Do not use street  drugs.  Do not share needles.  Ask your health care provider for help if you need support or information about quitting drugs.  Tell your health care provider if you often feel depressed.  Tell your health care provider if you have ever been abused or do not feel safe at home. This information is not intended to replace advice given to you by your health care provider. Make sure you discuss any questions you have with your health care provider. Document Released: 12/13/2010 Document Revised: 11/05/2015 Document Reviewed: 03/03/2015 Elsevier Interactive Patient Education  [employer].

## 2017-12-22 LAB — CYTOLOGY - PAP
BACTERIAL VAGINITIS: POSITIVE — AB
CANDIDA VAGINITIS: NEGATIVE
Chlamydia: NEGATIVE
DIAGNOSIS: NEGATIVE
HPV: NOT DETECTED
Neisseria Gonorrhea: NEGATIVE
TRICH (WINDOWPATH): NEGATIVE

## 2017-12-22 MED ORDER — METRONIDAZOLE 0.75 % VA GEL: 1.0000 | Freq: Every day | VAGINAL | 0 refills | Status: DC

## 2017-12-22 NOTE — Addendum Note (Signed)
Addended by: Alysia PennaNCHE, Airen Dales L on: 12/22/2017 12:56 PM   Modules accepted: Orders

## 2018-03-23 ENCOUNTER — Ambulatory Visit (INDEPENDENT_AMBULATORY_CARE_PROVIDER_SITE_OTHER): Payer: 59 | Admitting: Nurse Practitioner

## 2018-03-23 ENCOUNTER — Encounter: Payer: Self-pay | Admitting: Nurse Practitioner

## 2018-03-23 ENCOUNTER — Ambulatory Visit: Payer: Self-pay

## 2018-03-23 ENCOUNTER — Ambulatory Visit (INDEPENDENT_AMBULATORY_CARE_PROVIDER_SITE_OTHER): Payer: 59

## 2018-03-23 VITALS — BP 96/68 | HR 90 | Temp 98.1°F | Ht 64.5 in | Wt 204.0 lb

## 2018-03-23 DIAGNOSIS — R0781 Pleurodynia: Secondary | ICD-10-CM

## 2018-03-23 DIAGNOSIS — R7989 Other specified abnormal findings of blood chemistry: Secondary | ICD-10-CM | POA: Diagnosis not present

## 2018-03-23 DIAGNOSIS — R05 Cough: Secondary | ICD-10-CM | POA: Diagnosis not present

## 2018-03-23 DIAGNOSIS — R079 Chest pain, unspecified: Secondary | ICD-10-CM | POA: Diagnosis not present

## 2018-03-23 LAB — UNLABELED: TEST ORDERED ON REQ: 8659

## 2018-03-23 MED ORDER — KETOROLAC TROMETHAMINE 30 MG/ML IJ SOLN
30.0000 mg | Freq: Once | INTRAMUSCULAR | Status: AC
  Administered 2018-03-23: 30 mg via INTRAMUSCULAR

## 2018-03-23 NOTE — Progress Notes (Signed)
Subjective:  Patient ID: Wanda Hammond, female    DOB: [birth year]  Age: 32 y.o. MRN: [id number]  CC: Chest Pain (patient is complaining of dull chest pain last night, discomfort. started to cough this morning and it is worse when coughing. kids is sick. flu shot consut?)   Chest Pain   This is a new problem. The current episode started today. The onset quality is sudden. The problem occurs constantly. The problem has been unchanged. The pain is present in the lateral region. The quality of the pain is described as dull. The pain does not radiate. Associated symptoms include a cough and shortness of breath. Pertinent negatives include no back pain, diaphoresis, exertional chest pressure, fever, headaches, irregular heartbeat, malaise/fatigue, nausea, numbness, orthopnea, palpitations, sputum production or weakness. The cough is non-productive. The pain is aggravated by movement and breathing. She has tried nothing for the symptoms. Risk factors include obesity and hormone replacement therapy.  Pertinent negatives for past medical history include no aneurysm, no anxiety/panic attacks, no bicuspid aortic valve, no CAD, no diabetes, no hypertension and no stimulant use.  Her family medical history is significant for hyperlipidemia and hypertension.  Pertinent negatives for family medical history include: no aortic dissection, no CAD, no diabetes, no heart disease, no early MI, no PE, no stroke and no sudden death.  no recent travel. No tobacco use. IUD in place  Reviewed past Medical, Social and Family history today.  Outpatient Medications Prior to Visit  Medication Sig Dispense Refill  . levonorgestrel (MIRENA, 52 MG,) 20 MCG/24HR IUD Mirena 20 mcg/24 hours (5 yrs) 52 mg intrauterine device  Take 1 insert by intrauterine route.    . metroNIDAZOLE (METROGEL VAGINAL) 0.75 % vaginal gel Place 1 Applicatorful vaginally at bedtime. (Patient not taking: Reported on 03/23/2018) 70 g 0   No  facility-administered medications prior to visit.     ROS See HPI  Objective:  BP 96/68   Pulse 90   Temp 98.1 F (36.7 C) (Oral)   Ht 5' 4.5" (1.638 m)   Wt 204 lb (92.5 kg)   SpO2 98%   BMI 34.48 kg/m   BP Readings from Last 3 Encounters:  03/23/18 96/68  12/21/17 102/74  06/06/17 104/73    Wt Readings from Last 3 Encounters:  03/23/18 204 lb (92.5 kg)  12/21/17 199 lb (90.3 kg)  06/06/17 188 lb (85.3 kg)    Physical Exam  Constitutional: She appears well-developed and well-nourished.  Neck: Normal range of motion. Neck supple.  Cardiovascular: Regular rhythm and intact distal pulses.  Pulses:      Carotid pulses are 0 on the right side, and 0 on the left side. Pulmonary/Chest: Effort normal and breath sounds normal. No accessory muscle usage. She exhibits tenderness.  Left lateral chest wall tenderness  Abdominal: Soft. Bowel sounds are normal.  Skin: No rash noted.  Vitals reviewed.   Lab Results  Component Value Date   WBC 5.6 12/21/2017   HGB 13.7 12/21/2017   HCT 40.6 12/21/2017   PLT 270.0 12/21/2017   GLUCOSE 89 12/21/2017   CHOL 152 12/21/2017   TRIG 61.0 12/21/2017   HDL 58.70 12/21/2017   LDLCALC 81 12/21/2017   ALT 12 12/21/2017   AST 17 12/21/2017   NA 138 12/21/2017   K 3.9 12/21/2017   CL 105 12/21/2017   CREATININE 1.05 12/21/2017   BUN 12 12/21/2017   CO2 27 12/21/2017   TSH 2.03 12/21/2017   ECG: NSR, no ST elevation  or depression. No T wave abnormality.  Assessment & Plan:   Makeya was seen today for chest pain.  Diagnoses and all orders for this visit:  Anterior pleuritic pain -     EKG 12-Lead -     ketorolac (TORADOL) 30 MG/ML injection 30 mg -     DG Chest 2 View -     D-dimer, quantitative (not at [facility]) -     CT ANGIO CHEST AORTA W &/OR WO CONTRAST; Future  Positive D-dimer -     CT ANGIO CHEST AORTA W &/OR WO CONTRAST; Future  Other orders -     UNLABELED -     PAT ID [email or web address] [email or web address]   I am having Matthew C.  Earll maintain her levonorgestrel and metroNIDAZOLE. We administered ketorolac.  Meds ordered this encounter  Medications  . ketorolac (TORADOL) 30 MG/ML injection 30 mg    Follow-up: No follow-ups on file.  Alysia Penna, NP

## 2018-03-23 NOTE — Patient Instructions (Addendum)
ECG: normal Go for CXR Go to ED if pain does not improve within 12hrs or if worsens.   Chest Wall Pain Chest wall pain is pain in or around the bones and muscles of your chest. Sometimes, an injury causes this pain. Sometimes, the cause may not be known. This pain may take several weeks or longer to get better. Follow these instructions at home: Pay attention to any changes in your symptoms. Take these actions to help with your pain:  Rest as told by your doctor.  Avoid activities that cause pain. Try not to use your chest, belly (abdominal), or side muscles to lift heavy things.  If directed, apply ice to the painful area: ? Put ice in a plastic bag. ? Place a towel between your skin and the bag. ? Leave the ice on for 20 minutes, 2-3 times per day.  Take over-the-counter and prescription medicines only as told by your doctor.  Do not use tobacco products, including cigarettes, chewing tobacco, and e-cigarettes. If you need help quitting, ask your doctor.  Keep all follow-up visits as told by your doctor. This is important.  Contact a doctor if:  You have a fever.  Your chest pain gets worse.  You have new symptoms. Get help right away if:  You feel sick to your stomach (nauseous) or you throw up (vomit).  You feel sweaty or light-headed.  You have a cough with phlegm (sputum) or you cough up blood.  You are short of breath. This information is not intended to replace advice given to you by your health care provider. Make sure you discuss any questions you have with your health care provider. Document Released: 11/16/2007 Document Revised: 11/05/2015 Document Reviewed: 08/25/2014 Elsevier Interactive Patient Education  [employer].

## 2018-03-23 NOTE — Telephone Encounter (Signed)
Patient called in with c/o "chest pain." She says "it started yesterday, I had 2 episodes of chest pain lasting about 30-45 minutes last night, then went away. I started to go to the hospital, but decided to tough it out. Then this morning, I had it again, but now it's a dull pain at a 3. I'm at work. My children have been sick with viruses. I do have a cough, but I just deal with it." I asked about other symptoms, she says "I did have some breathing difficulty last night when I had the pain, but I think it was related to the pain, because it went away when the pain subsided." According to protocol, see PCP within 24 hours, appointment scheduled today at 1615 with Alysia Penna, NP, care advice given, patient verbalized understanding.  Reason for Disposition . [1] Chest pain lasts > 5 minutes AND [2] occurred > 3 days ago (72 hours) AND [3] NO chest pain or cardiac symptoms now  Answer Assessment - Initial Assessment Questions 1. LOCATION: "Where does it hurt?"       Center towards the right 2. RADIATION: "Does the pain go anywhere else?" (e.g., into neck, jaw, arms, back)    Feels like it's going through my back 3. ONSET: "When did the chest pain begin?" (Minutes, hours or days)      Yesterday 2 episodes and went away 4. PATTERN "Does the pain come and go, or has it been constant since it started?"  "Does it get worse with exertion?"      Come and go; got worse with exertion-coughing 5. DURATION: "How long does it last" (e.g., seconds, minutes, hours)     30-45 minutes last night 6. SEVERITY: "How bad is the pain?"  (e.g., Scale 1-10; mild, moderate, or severe)    - MILD (1-3): doesn't interfere with normal activities     - MODERATE (4-7): interferes with normal activities or awakens from sleep    - SEVERE (8-10): excruciating pain, unable to do any normal activities       Dull pain right now 3 7. CARDIAC RISK FACTORS: "Do you have any history of heart problems or risk factors for heart  disease?" (e.g., prior heart attack, angina; high blood pressure, diabetes, being overweight, high cholesterol, smoking, or strong family history of heart disease)     No 8. PULMONARY RISK FACTORS: "Do you have any history of lung disease?"  (e.g., blood clots in lung, asthma, emphysema, birth control pills)     IUD 9. CAUSE: "What do you think is causing the chest pain?"     I don't know 10. OTHER SYMPTOMS: "Do you have any other symptoms?" (e.g., dizziness, nausea, vomiting, sweating, fever, difficulty breathing, cough)      Cough last night, last night difficulty breathing with the pain 11. PREGNANCY: "Is there any chance you are pregnant?" "When was your last menstrual period?"       No  Protocols used: CHEST PAIN-A-AH

## 2018-03-26 ENCOUNTER — Encounter: Payer: Self-pay | Admitting: Nurse Practitioner

## 2018-03-26 LAB — PAT ID TIQ DOC

## 2018-03-26 LAB — D-DIMER, QUANTITATIVE: D-Dimer, Quant: 0.82 mcg/mL FEU — ABNORMAL HIGH (ref <0.50)

## 2018-03-28 ENCOUNTER — Ambulatory Visit (INDEPENDENT_AMBULATORY_CARE_PROVIDER_SITE_OTHER)
Admission: RE | Admit: 2018-03-28 | Discharge: 2018-03-28 | Disposition: A | Discharge status: Home / Self Care | Payer: 59 | Source: Ambulatory Visit | Attending: Nurse Practitioner | Admitting: Nurse Practitioner

## 2018-03-28 DIAGNOSIS — R7989 Other specified abnormal findings of blood chemistry: Secondary | ICD-10-CM

## 2018-03-28 DIAGNOSIS — R0781 Pleurodynia: Secondary | ICD-10-CM | POA: Diagnosis not present

## 2018-03-28 MED ORDER — IOPAMIDOL (ISOVUE-370) INJECTION 76%
80.0000 mL | Freq: Once | INTRAVENOUS | Status: AC | PRN
  Administered 2018-03-28: 80 mL via INTRAVENOUS

## 2018-07-18 ENCOUNTER — Encounter: Payer: Self-pay | Admitting: Nurse Practitioner

## 2018-07-18 ENCOUNTER — Ambulatory Visit (INDEPENDENT_AMBULATORY_CARE_PROVIDER_SITE_OTHER): Payer: 59 | Admitting: Nurse Practitioner

## 2018-07-18 VITALS — BP 134/76 | HR 99 | Temp 98.3°F | Ht 64.5 in | Wt 200.0 lb

## 2018-07-18 DIAGNOSIS — J0140 Acute pansinusitis, unspecified: Secondary | ICD-10-CM

## 2018-07-18 DIAGNOSIS — J209 Acute bronchitis, unspecified: Secondary | ICD-10-CM | POA: Diagnosis not present

## 2018-07-18 MED ORDER — ALBUTEROL SULFATE HFA 108 (90 BASE) MCG/ACT IN AERS: 1.0000 | INHALATION_SPRAY | Freq: Four times a day (QID) | RESPIRATORY_TRACT | 0 refills | Status: DC | PRN

## 2018-07-18 MED ORDER — AZITHROMYCIN 250 MG PO TABS: 250.0000 mg | ORAL_TABLET | Freq: Every day | ORAL | 0 refills | Status: DC

## 2018-07-18 MED ORDER — GUAIFENESIN ER 600 MG PO TB12: 600.0000 mg | ORAL_TABLET | Freq: Two times a day (BID) | ORAL | 0 refills | Status: DC | PRN

## 2018-07-18 MED ORDER — FLUTICASONE PROPIONATE 50 MCG/ACT NA SUSP: 2.0000 | Freq: Every day | NASAL | 0 refills | Status: DC

## 2018-07-18 MED ORDER — HYDROCODONE-HOMATROPINE 5-1.5 MG/5ML PO SYRP: 5.0000 mL | ORAL_SOLUTION | Freq: Two times a day (BID) | ORAL | 0 refills | Status: DC | PRN

## 2018-07-18 NOTE — Progress Notes (Signed)
Subjective:  Patient ID: Wanda Hammond, female    DOB: April 04, 1986  Age: 33 y.o. MRN: [id number]  CC: Cough (pt is c/o coughing clear,nose congestions,ear stop up,hurt when couging/6 days/ took mucinex and cough drops/)  Cough  This is a new problem. The current episode started in the past 7 days. The problem has been gradually worsening. The problem occurs constantly. The cough is productive of sputum. Associated symptoms include chills, ear pain, myalgias, nasal congestion, postnasal drip, rhinorrhea, a sore throat and shortness of breath. Pertinent negatives include no chest pain, fever, heartburn or wheezing. The symptoms are aggravated by lying down and cold air. She has tried OTC cough suppressant for the symptoms. The treatment provided no relief.   Reviewed past Medical, Social and Family history today.  Outpatient Medications Prior to Visit  Medication Sig Dispense Refill  . levonorgestrel (MIRENA, 52 MG,) 20 MCG/24HR IUD Mirena 20 mcg/24 hours (5 yrs) 52 mg intrauterine device  Take 1 insert by intrauterine route.    . metroNIDAZOLE (METROGEL VAGINAL) 0.75 % vaginal gel Place 1 Applicatorful vaginally at bedtime. (Patient not taking: Reported on 03/23/2018) 70 g 0   No facility-administered medications prior to visit.     ROS See HPI  Objective:  BP 134/76   Pulse 99   Temp 98.3 F (36.8 C) (Oral)   Ht 5' 4.5" (1.638 m)   Wt 200 lb (90.7 kg)   SpO2 96%   BMI 33.80 kg/m   BP Readings from Last 3 Encounters:  07/18/18 134/76  03/23/18 96/68  12/21/17 102/74    Wt Readings from Last 3 Encounters:  07/18/18 200 lb (90.7 kg)  03/23/18 204 lb (92.5 kg)  12/21/17 199 lb (90.3 kg)    Physical Exam HENT:     Nose: Mucosal edema and rhinorrhea present.     Right Turbinates: Swollen.     Left Turbinates: Swollen.     Right Sinus: Maxillary sinus tenderness and frontal sinus tenderness present.     Left Sinus: Maxillary sinus tenderness and frontal sinus  tenderness present.     Mouth/Throat:     Mouth: Mucous membranes are moist.     Pharynx: Posterior oropharyngeal erythema present.     Tonsils: No tonsillar exudate. Swelling: 2+ on the right. 2+ on the left.  Cardiovascular:     Rate and Rhythm: Normal rate and regular rhythm.  Pulmonary:     Effort: Pulmonary effort is normal.     Breath sounds: Normal breath sounds.  Musculoskeletal:     Right lower leg: No edema.     Left lower leg: No edema.  Skin:    Findings: No rash.  Neurological:     Mental Status: She is alert and oriented to person, place, and time.     Lab Results  Component Value Date   WBC 5.6 12/21/2017   HGB 13.7 12/21/2017   HCT 40.6 12/21/2017   PLT 270.0 12/21/2017   GLUCOSE 89 12/21/2017   CHOL 152 12/21/2017   TRIG 61.0 12/21/2017   HDL 58.70 12/21/2017   LDLCALC 81 12/21/2017   ALT 12 12/21/2017   AST 17 12/21/2017   NA 138 12/21/2017   K 3.9 12/21/2017   CL 105 12/21/2017   CREATININE 1.05 12/21/2017   BUN 12 12/21/2017   CO2 27 12/21/2017   TSH 2.03 12/21/2017    Ct Angio Chest Pe W Or Wo Contrast  Result Date: 03/28/2018 CLINICAL DATA:  Pleuritic chest pain and elevated D-dimer.  EXAM: CT ANGIOGRAPHY CHEST WITH CONTRAST TECHNIQUE: Multidetector CT imaging of the chest was performed using the standard protocol during bolus administration of intravenous contrast. Multiplanar CT image reconstructions and MIPs were obtained to evaluate the vascular anatomy. CONTRAST:  56mL ISOVUE-370 IOPAMIDOL (ISOVUE-370) INJECTION 76% COMPARISON:  None. FINDINGS: Cardiovascular: --Pulmonary arteries: Contrast injection is sufficient to demonstrate satisfactory opacification of the pulmonary arteries to the segmental level. There is no pulmonary embolus. The main pulmonary artery is within normal limits for size. --Aorta: Satisfactory opacification of the thoracic aorta. No aortic dissection or other acute aortic syndrome. Conventional 3 vessel aortic branching  pattern. The aortic course and caliber are normal. There is no aortic atherosclerosis. --Heart: Normal size. No pericardial effusion. Mediastinum/Nodes: No mediastinal, hilar or axillary lymphadenopathy. The visualized thyroid and thoracic esophageal course are unremarkable. Lungs/Pleura: No pulmonary nodules or masses. No pleural effusion or pneumothorax. No focal airspace consolidation. No focal pleural abnormality. Upper Abdomen: Contrast bolus timing is not optimized for evaluation of the abdominal organs. Within this limitation, the visualized organs of the upper abdomen are normal. Musculoskeletal: No chest wall abnormality. No acute or significant osseous findings. Review of the MIP images confirms the above findings. IMPRESSION: Normal chest.  No pulmonary embolus. Electronically Signed   By: Deatra Robinson M.D.   On: 03/28/2018 14:56    Assessment & Plan:   Yashi was seen today for cough.  Diagnoses and all orders for this visit:  Acute non-recurrent pansinusitis -     guaiFENesin (MUCINEX) 600 MG 12 hr tablet; Take 1 tablet (600 mg total) by mouth 2 (two) times daily as needed for cough or to loosen phlegm. -     albuterol (PROVENTIL HFA;VENTOLIN HFA) 108 (90 Base) MCG/ACT inhaler; Inhale 1-2 puffs into the lungs every 6 (six) hours as needed. -     fluticasone (FLONASE) 50 MCG/ACT nasal spray; Place 2 sprays into both nostrils daily. -     HYDROcodone-homatropine (HYCODAN) 5-1.5 MG/5ML syrup; Take 5 mLs by mouth every 12 (twelve) hours as needed. -     azithromycin (ZITHROMAX Z-PAK) 250 MG tablet; Take 1 tablet (250 mg total) by mouth daily. Take 2tabs on first day, then 1tab once a day till complete  Acute bronchitis, unspecified organism -     guaiFENesin (MUCINEX) 600 MG 12 hr tablet; Take 1 tablet (600 mg total) by mouth 2 (two) times daily as needed for cough or to loosen phlegm. -     albuterol (PROVENTIL HFA;VENTOLIN HFA) 108 (90 Base) MCG/ACT inhaler; Inhale 1-2 puffs into the  lungs every 6 (six) hours as needed. -     fluticasone (FLONASE) 50 MCG/ACT nasal spray; Place 2 sprays into both nostrils daily. -     HYDROcodone-homatropine (HYCODAN) 5-1.5 MG/5ML syrup; Take 5 mLs by mouth every 12 (twelve) hours as needed. -     azithromycin (ZITHROMAX Z-PAK) 250 MG tablet; Take 1 tablet (250 mg total) by mouth daily. Take 2tabs on first day, then 1tab once a day till complete   I have discontinued Muranda C. Termine's metroNIDAZOLE. I am also having her start on guaiFENesin, albuterol, fluticasone, HYDROcodone-homatropine, and azithromycin. Additionally, I am having her maintain her levonorgestrel.  Meds ordered this encounter  Medications  . guaiFENesin (MUCINEX) 600 MG 12 hr tablet    Sig: Take 1 tablet (600 mg total) by mouth 2 (two) times daily as needed for cough or to loosen phlegm.    Dispense:  14 tablet    Refill:  0  Order Specific Question:   Supervising Provider    Answer:   Dianne DunARON, TALIA M [[id number]]  . albuterol (PROVENTIL HFA;VENTOLIN HFA) 108 (90 Base) MCG/ACT inhaler    Sig: Inhale 1-2 puffs into the lungs every 6 (six) hours as needed.    Dispense:  1 Inhaler    Refill:  0    Order Specific Question:   Supervising Provider    Answer:   Dianne DunARON, TALIA M [[id number]]  . fluticasone (FLONASE) 50 MCG/ACT nasal spray    Sig: Place 2 sprays into both nostrils daily.    Dispense:  16 g    Refill:  0    Order Specific Question:   Supervising Provider    Answer:   Dianne DunARON, TALIA M [[id number]]  . HYDROcodone-homatropine (HYCODAN) 5-1.5 MG/5ML syrup    Sig: Take 5 mLs by mouth every 12 (twelve) hours as needed.    Dispense:  60 mL    Refill:  0    Order Specific Question:   Supervising Provider    Answer:   Dianne DunARON, TALIA M [[id number]]  . azithromycin (ZITHROMAX Z-PAK) 250 MG tablet    Sig: Take 1 tablet (250 mg total) by mouth daily. Take 2tabs on first day, then 1tab once a day till complete    Dispense:  6 tablet    Refill:  0    Order Specific Question:   Supervising  Provider    Answer:   Dianne DunARON, TALIA M [[id number]]    Problem List Items Addressed This Visit    None    Visit Diagnoses    Acute non-recurrent pansinusitis    -  Primary   Relevant Medications   guaiFENesin (MUCINEX) 600 MG 12 hr tablet   albuterol (PROVENTIL HFA;VENTOLIN HFA) 108 (90 Base) MCG/ACT inhaler   fluticasone (FLONASE) 50 MCG/ACT nasal spray   HYDROcodone-homatropine (HYCODAN) 5-1.5 MG/5ML syrup   azithromycin (ZITHROMAX Z-PAK) 250 MG tablet   Acute bronchitis, unspecified organism       Relevant Medications   guaiFENesin (MUCINEX) 600 MG 12 hr tablet   albuterol (PROVENTIL HFA;VENTOLIN HFA) 108 (90 Base) MCG/ACT inhaler   fluticasone (FLONASE) 50 MCG/ACT nasal spray   HYDROcodone-homatropine (HYCODAN) 5-1.5 MG/5ML syrup   azithromycin (ZITHROMAX Z-PAK) 250 MG tablet       Follow-up: Return if symptoms worsen or fail to improve.  Alysia Pennaharlotte Sadiya Durand, NP

## 2018-07-18 NOTE — Patient Instructions (Signed)
Maintain adequate oral hydration.  Acute Bronchitis, Adult Acute bronchitis is when air tubes (bronchi) in the lungs suddenly get swollen. The condition can make it hard to breathe. It can also cause these symptoms:  A cough.  Coughing up clear, yellow, or green mucus.  Wheezing.  Chest congestion.  Shortness of breath.  A fever.  Body aches.  Chills.  A sore throat. Follow these instructions at home:  Medicines  Take over-the-counter and prescription medicines only as told by your doctor.  If you were prescribed an antibiotic medicine, take it as told by your doctor. Do not stop taking the antibiotic even if you start to feel better. General instructions  Rest.  Drink enough fluids to keep your pee (urine) pale yellow.  Avoid smoking and secondhand smoke. If you smoke and you need help quitting, ask your doctor. Quitting will help your lungs heal faster.  Use an inhaler, cool mist vaporizer, or humidifier as told by your doctor.  Keep all follow-up visits as told by your doctor. This is important. How is this prevented? To lower your risk of getting this condition again:  Wash your hands often with soap and water. If you cannot use soap and water, use hand sanitizer.  Avoid contact with people who have cold symptoms.  Try not to touch your hands to your mouth, nose, or eyes.  Make sure to get the flu shot every year. Contact a doctor if:  Your symptoms do not get better in 2 weeks. Get help right away if:  You cough up blood.  You have chest pain.  You have very bad shortness of breath.  You become dehydrated.  You faint (pass out) or keep feeling like you are going to pass out.  You keep throwing up (vomiting).  You have a very bad headache.  Your fever or chills gets worse. This information is not intended to replace advice given to you by your health care provider. Make sure you discuss any questions you have with your health care  provider. Document Released: 11/16/2007 Document Revised: 01/11/2017 Document Reviewed: 11/18/2015 Elsevier Interactive Patient Education  2019 [employer]

## 2018-08-09 ENCOUNTER — Other Ambulatory Visit: Payer: Self-pay | Admitting: Nurse Practitioner

## 2018-08-09 DIAGNOSIS — J209 Acute bronchitis, unspecified: Secondary | ICD-10-CM

## 2018-08-09 DIAGNOSIS — J0140 Acute pansinusitis, unspecified: Secondary | ICD-10-CM

## 2018-08-09 NOTE — Telephone Encounter (Signed)
Left vm for the pt to call back, need to ask if she need refill for this med? Or [facility] auto request this med.

## 2018-08-09 NOTE — Telephone Encounter (Signed)
Refill not needed. Hx hx of asthma of COPD. Was prescribed only for acute illness

## 2019-08-08 ENCOUNTER — Other Ambulatory Visit: Payer: Self-pay

## 2019-08-08 ENCOUNTER — Ambulatory Visit: Payer: Self-pay | Attending: Internal Medicine

## 2019-08-08 DIAGNOSIS — Z23 Encounter for immunization: Secondary | ICD-10-CM | POA: Insufficient documentation

## 2019-08-08 NOTE — Progress Notes (Signed)
   Covid-19 Vaccination Clinic  Name:  Jacquel Redditt    MRN: [id number] DOB: [birth year]  08/08/2019  Ms. Hubner was observed post Covid-19 immunization for 15 minutes without incidence. She was provided with Vaccine Information Sheet and instruction to access the V-Safe system.   Ms. Mastrogiovanni was instructed to call 911 with any severe reactions post vaccine: Marland Kitchen Difficulty breathing  . Swelling of your face and throat  . A fast heartbeat  . A bad rash all over your body  . Dizziness and weakness    Immunizations Administered    Name Date Dose VIS Date Route   Moderna COVID-19 Vaccine 08/08/2019  1:17 PM 0.5 mL 05/14/2019 Intramuscular   Manufacturer: Moderna   Lot: [id number]   NDC: 20802-233-61

## 2019-09-10 ENCOUNTER — Ambulatory Visit: Payer: Self-pay | Attending: Family

## 2019-09-10 DIAGNOSIS — Z23 Encounter for immunization: Secondary | ICD-10-CM

## 2019-09-10 NOTE — Progress Notes (Signed)
   Covid-19 Vaccination Clinic  Name:  Wanda Hammond    MRN: [id number] DOB: 1986/03/27  09/10/2019  [name] was observed post Covid-19 immunization for 15 minutes without incident. She was provided with Vaccine Information Sheet and instruction to access the V-Safe system.   [name] was instructed to call 911 with any severe reactions post vaccine: Marland Kitchen Difficulty breathing  . Swelling of face and throat  . A fast heartbeat  . A bad rash all over body  . Dizziness and weakness   Immunizations Administered    Name Date Dose VIS Date Route   Moderna COVID-19 Vaccine 09/10/2019  9:16 AM 0.5 mL 05/14/2019 Intramuscular   Manufacturer: Moderna   Lot: [id number]   NDC: 16384-665-99

## 2019-11-18 ENCOUNTER — Telehealth (INDEPENDENT_AMBULATORY_CARE_PROVIDER_SITE_OTHER): Payer: BC Managed Care – PPO | Admitting: Family

## 2019-11-18 VITALS — Temp 98.1°F | Wt 213.8 lb

## 2019-11-18 DIAGNOSIS — L239 Allergic contact dermatitis, unspecified cause: Secondary | ICD-10-CM

## 2019-11-18 MED ORDER — PREDNISONE 20 MG PO TABS: 40.0000 mg | ORAL_TABLET | Freq: Every day | ORAL | 0 refills | Status: DC

## 2019-11-18 NOTE — Progress Notes (Signed)
   Virtual Visit via Video   I connected with patient on 11/18/19 at  1:00 PM EDT by a video enabled telemedicine application and verified that I am speaking with the correct person using two identifiers.  Location patient: Home Location provider: [facility], Office Persons participating in the virtual visit: Patient and her husband, Provider  I discussed the limitations of evaluation and management by telemedicine and the availability of in person appointments. The patient expressed understanding and agreed to proceed.  Subjective:   HPI:  34 year old requests a video visit with c/o a rash all over her body x 2 days. She reports being in the sun and anytime she is in the sun this happens to her. She has been taking benadryl that helps the itching but the rash persists. She denies any changes in detergents, soaps, or lotions  ROS:   See pertinent positives and negatives per HPI.  Patient Active Problem List   Diagnosis Date Noted  . Hx of abnormal cervical Papanicolaou smear 12/21/2017  . Anemia 01/02/2012  . Irregular periods/menstrual cycles 09/23/2011  . Family history of Downs syndrome 09/23/2011    Social History   Tobacco Use  . Smoking status: Never Smoker  . Smokeless tobacco: Never Used  Substance Use Topics  . Alcohol use: Yes    Comment: once in a while    Current Outpatient Medications:  .  albuterol (PROVENTIL HFA;VENTOLIN HFA) 108 (90 Base) MCG/ACT inhaler, Inhale 1-2 puffs into the lungs every 6 (six) hours as needed., Disp: 1 Inhaler, Rfl: 0 .  levonorgestrel (MIRENA, 52 MG,) 20 MCG/24HR IUD, Mirena 20 mcg/24 hours (5 yrs) 52 mg intrauterine device  Take 1 insert by intrauterine route., Disp: , Rfl:  .  predniSONE (DELTASONE) 20 MG tablet, Take 2 tablets (40 mg total) by mouth daily with breakfast., Disp: 10 tablet, Rfl: 0  Allergies  Allergen Reactions  . Latex Itching and Rash    Objective:   Temp 98.1 F (36.7 C) (Oral) Comment: pt.  reported  Wt 213 lb 12.8 oz (97 kg) Comment: pt. reported  BMI 36.13 kg/m   Patient is well-developed, well-nourished in no acute distress.  Resting comfortably at home.  Head is normocephalic, atraumatic.  No labored breathing.  Speech is clear and coherent with logical content.  Wide spread, dry, pruritic appearing rash noted to the arms, face, neck, chest and back.  Patient is alert and oriented at baseline.   Assessment and Plan:        Jamielyn was seen today for rash.  Diagnoses and all orders for this visit:  Allergic contact dermatitis, unspecified trigger  Other orders -     predniSONE (DELTASONE) 20 MG tablet; Take 2 tablets (40 mg total) by mouth daily with breakfast.     Eulis Foster, FNP 11/18/2019  Time spent with the patient: 15 minutes, of which >50% was spent in obtaining information about symptoms, reviewing previous labs, evaluations, and treatments, counseling about condition (please see the discussed topics above), and developing a plan to further investigate it; had a number of questions which I addressed.

## 2020-02-28 ENCOUNTER — Telehealth: Payer: BC Managed Care – PPO | Admitting: Emergency Medicine

## 2020-02-28 DIAGNOSIS — T6592XA Toxic effect of unspecified substance, intentional self-harm, initial encounter: Secondary | ICD-10-CM

## 2020-02-28 MED ORDER — METHYLPREDNISOLONE 4 MG PO TBPK: ORAL_TABLET | ORAL | 0 refills | Status: DC

## 2020-02-28 NOTE — Progress Notes (Signed)
E Visit for Rash  We are sorry that you are not feeling well. Here is how we plan to help!  It appears you are having an allergic reaction to a chemical substance.  I have prescribed a Medrol dosepack- Use as directed    HOME CARE:   Take cool showers and avoid direct sunlight.  Apply cool compress or wet dressings.  Take a bath in an oatmeal bath.  Sprinkle content of one Aveeno packet under running faucet with comfortably warm water.  Bathe for 15-20 minutes, 1-2 times daily.  Pat dry with a towel. Do not rub the rash.  Use hydrocortisone cream.  Take an antihistamine like Benadryl for widespread rashes that itch.  The adult dose of Benadryl is 25-50 mg by mouth 4 times daily.  Caution:  This type of medication may cause sleepiness.  Do not drink alcohol, drive, or operate dangerous machinery while taking antihistamines.  Do not take these medications if you have prostate enlargement.  Read package instructions thoroughly on all medications that you take.  GET HELP RIGHT AWAY IF:   Symptoms don't go away after treatment.  Severe itching that persists.  If you rash spreads or swells.  If you rash begins to smell.  If it blisters and opens or develops a yellow-brown crust.  You develop a fever.  You have a sore throat.  You become short of breath.  MAKE SURE YOU:  Understand these instructions. Will watch your condition. Will get help right away if you are not doing well or get worse.  Thank you for choosing an e-visit. Your e-visit answers were reviewed by a board certified advanced clinical practitioner to complete your personal care plan. Depending upon the condition, your plan could have included both over the counter or prescription medications. Please review your pharmacy choice. Be sure that the pharmacy you have chosen is open so that you can pick up your prescription now.  If there is a problem you may message your provider in MyChart to have the  prescription routed to another pharmacy. Your safety is important to [place]. If you have drug allergies check your prescription carefully.  For the next 24 hours, you can use MyChart to ask questions about today's visit, request a non-urgent call back, or ask for a work or school excuse from your e-visit provider. You will get an email in the next two days asking about your experience. I hope that your e-visit has been valuable and will speed your recovery.     **Please do not respond to this message unless you have follow up questions.** Greater than 5 but less than 10 minutes spent researching, coordinating, and implementing care for this patient today

## 2020-03-01 ENCOUNTER — Other Ambulatory Visit: Payer: Self-pay

## 2020-03-01 ENCOUNTER — Ambulatory Visit
Admission: EM | Admit: 2020-03-01 | Discharge: 2020-03-01 | Disposition: A | Discharge status: Home / Self Care | Payer: BC Managed Care – PPO | Attending: Physician Assistant | Admitting: Physician Assistant

## 2020-03-01 ENCOUNTER — Telehealth: Payer: BC Managed Care – PPO | Admitting: Family

## 2020-03-01 DIAGNOSIS — L239 Allergic contact dermatitis, unspecified cause: Secondary | ICD-10-CM

## 2020-03-01 DIAGNOSIS — T7840XD Allergy, unspecified, subsequent encounter: Secondary | ICD-10-CM

## 2020-03-01 MED ORDER — DESONIDE 0.05 % EX CREA: TOPICAL_CREAM | Freq: Two times a day (BID) | CUTANEOUS | 0 refills | Status: DC

## 2020-03-01 MED ORDER — DEXAMETHASONE SODIUM PHOSPHATE 10 MG/ML IJ SOLN
10.0000 mg | Freq: Once | INTRAMUSCULAR | Status: AC
  Administered 2020-03-01: 10 mg via INTRAMUSCULAR

## 2020-03-01 NOTE — ED Provider Notes (Signed)
[facility]    CSN: [id number] Arrival date & time: 03/01/20  1508      History   Chief Complaint Chief Complaint  Patient presents with  . Facial Swelling    HPI Wanda Hammond is a 34 y.o. female.   34 year old female comes in for facial swelling, rash, itching after having lamination and Henna done 4 days ago to the eyebrows. Rash along the eyebrow that is itching in sensation. Significant swelling to the eyebrows, eyelids. Did video visit and was started on prednisone tapered course. Has not noticed any improvement.      Past Medical History:  Diagnosis Date  . Chicken pox   . H/O varicella   . Laceration of vaginal wall or sulcus without perineal laceration during delivery 02/19/2012   2nd degree vaginal laceration; just left of pt's ML.  Repaired w/ 3 interrupted stitches  . No pertinent past medical history   . NSVD (normal spontaneous vaginal delivery) 02/19/2012  . UTI (urinary tract infection)     Patient Active Problem List   Diagnosis Date Noted  . Hx of abnormal cervical Papanicolaou smear 12/21/2017  . Anemia 01/02/2012  . Irregular periods/menstrual cycles 09/23/2011  . Family history of Downs syndrome 09/23/2011    History reviewed. No pertinent surgical history.  OB History    Gravida  2   Para  2   Term  2   Preterm  0   AB  0   Living  2     SAB  0   TAB  0   Ectopic  0   Multiple  0   Live Births  2            Home Medications    Prior to Admission medications   Medication Sig Start Date End Date Taking? Authorizing Provider  albuterol (PROVENTIL HFA;VENTOLIN HFA) 108 (90 Base) MCG/ACT inhaler Inhale 1-2 puffs into the lungs every 6 (six) hours as needed. 07/18/18   Nche, Bonna Gains, NP  desonide (DESOWEN) 0.05 % cream Apply topically 2 (two) times daily. 03/01/20   Belinda Fisher, PA-C  levonorgestrel (MIRENA, 52 MG,) 20 MCG/24HR IUD Mirena 20 mcg/24 hours (5 yrs) 52 mg intrauterine device  Take 1  insert by intrauterine route.    [provider]  methylPREDNISolone (MEDROL DOSEPAK) 4 MG TBPK tablet Use as directed 02/28/20   Arthor Captain, PA-C  predniSONE (DELTASONE) 20 MG tablet Take 2 tablets (40 mg total) by mouth daily with breakfast. Patient not taking: Reported on 02/28/2020 11/18/19   Eulis Foster, FNP    Family History Family History  Problem Relation Age of Onset  . Diabetes Maternal Grandmother   . Down syndrome Brother   . Diabetes Mother   . Hypertension Mother   . Arthritis Mother   . Cancer Father        lung  . Alcohol abuse Father   . Asthma Father   . Hypertension Father   . Anesthesia problems Neg Hx     Social History Social History   Tobacco Use  . Smoking status: Never Smoker  . Smokeless tobacco: Never Used  Vaping Use  . Vaping Use: Never used  Substance Use Topics  . Alcohol use: Yes    Comment: once in a while  . Drug use: No     Allergies   Latex   Review of Systems Review of Systems  Reason unable to perform ROS: See HPI as above.  Physical Exam Triage Vital Signs ED Triage Vitals  Enc Vitals Group     BP 03/01/20 1607 108/69     Pulse Rate 03/01/20 1607 85     Resp 03/01/20 1607 14     Temp 03/01/20 1607 98.1 F (36.7 C)     Temp Source 03/01/20 1607 Oral     SpO2 03/01/20 1607 98 %     Weight --      Height --      Head Circumference --      Peak Flow --      Pain Score 03/01/20 1617 1     Pain Loc --      Pain Edu? --      Excl. in GC? --    No data found.  Updated Vital Signs BP 108/69 (BP Location: Right Arm)   Pulse 85   Temp 98.1 F (36.7 C) (Oral)   Resp 14   SpO2 98%    Physical Exam Constitutional:      General: She is not in acute distress.    Appearance: Normal appearance. She is well-developed. She is not toxic-appearing or diaphoretic.  HENT:     Head: Normocephalic and atraumatic.     Comments: Maculopapular/vesicular rash along eyebrows with surrounding erythema.   Diffuse swelling to the eyebrows, upper and lower eyelids.    Mouth/Throat:     Comments: Handling own secretions well. Eyes:     Conjunctiva/sclera: Conjunctivae normal.     Pupils: Pupils are equal, round, and reactive to light.  Pulmonary:     Effort: Pulmonary effort is normal. No respiratory distress.  Musculoskeletal:     Cervical back: Normal range of motion and neck supple.  Skin:    General: Skin is warm and dry.  Neurological:     Mental Status: She is alert and oriented to person, place, and time.      UC Treatments / Results  Labs (all labs ordered are listed, but only abnormal results are displayed) Labs Reviewed - No data to display  EKG   Radiology No results found.  Procedures Procedures (including critical care time)  Medications Ordered in UC Medications  dexamethasone (DECADRON) injection 10 mg (10 mg Intramuscular Given 03/01/20 1645)    Initial Impression / Assessment and Plan / UC Course  I have reviewed the triage vital signs and the nursing notes.  Pertinent labs & imaging results that were available during my care of the patient were reviewed by me and considered in my medical decision making (see chart for details).    Diffuse facial swelling and dermatitis assistant with allergic reaction from new product.  Already on prednisone, antihistamine without relief.  Will do a dose of Decadron and add desonide for treatment.  Return precautions given.  Final Clinical Impressions(s) / UC Diagnoses   Final diagnoses:  Allergic dermatitis   ED Prescriptions    Medication Sig Dispense Auth. Provider   desonide (DESOWEN) 0.05 % cream Apply topically 2 (two) times daily. 30 g Belinda Fisher, PA-C     PDMP not reviewed this encounter.   Belinda Fisher, PA-C 03/01/20 1700

## 2020-03-01 NOTE — Progress Notes (Signed)
Patient is currently being seen in person at [facility]; no charge for this visit.   I am so sorry for the delay in our responding to you. There was a problem with our system notification. I am glad you are already at the [facility] as this is what we would have recommended. Please plan to follow-up with your PCP in the next 1-2 days for re-evaluation.

## 2020-03-01 NOTE — Discharge Instructions (Signed)
Decadron injection in office today. Continue prednisone as directed. Desonide sparingly to the face. Continue zyrtec/benadryl. Continue ice compress. Follow up with PCP if symptoms still not improving. If having trouble breathing, trouble swallowing, go to the emergency department for further evaluation.

## 2020-03-01 NOTE — ED Triage Notes (Signed)
Patient presents for evaluation of facial swelling that is progressing since Friday after having lamination and henna on her eyebrows.  She has been seen via video visit and prescribed Prednisone on Friday.

## 2020-03-04 ENCOUNTER — Ambulatory Visit: Payer: BC Managed Care – PPO | Admitting: Nurse Practitioner

## 2020-03-18 ENCOUNTER — Telehealth: Payer: Self-pay | Admitting: Nurse Practitioner

## 2020-03-18 NOTE — Telephone Encounter (Signed)
Patient sent MyChart request for an appointment. LVM to give the office a call to schedule.

## 2020-04-15 ENCOUNTER — Encounter: Payer: BC Managed Care – PPO | Admitting: Nurse Practitioner

## 2020-06-11 ENCOUNTER — Encounter: Payer: BC Managed Care – PPO | Admitting: Nurse Practitioner

## 2020-06-22 ENCOUNTER — Encounter: Payer: BC Managed Care – PPO | Admitting: Nurse Practitioner

## 2020-07-18 IMAGING — CT CT ANGIO CHEST
2 of 8 series · 18 of 46 positions shown · IV contrast (iopamidol)
Comparison: None.

CLINICAL DATA: Pleuritic chest pain and elevated D-dimer.

EXAM:
CT ANGIOGRAPHY CHEST WITH CONTRAST
TECHNIQUE: Multidetector CT imaging of the chest was performed using the
standard protocol during bolus administration of intravenous
contrast. Multiplanar CT image reconstructions and MIPs were
obtained to evaluate the vascular anatomy.
CONTRAST:  80mL 5RGCR2-UVS IOPAMIDOL (5RGCR2-UVS) INJECTION 76%

[Series 6: thins · axial · 0.66mm/px · z∈[-316,-69]mm · 15 of 273 slices shown]
[im 13/273  lung]
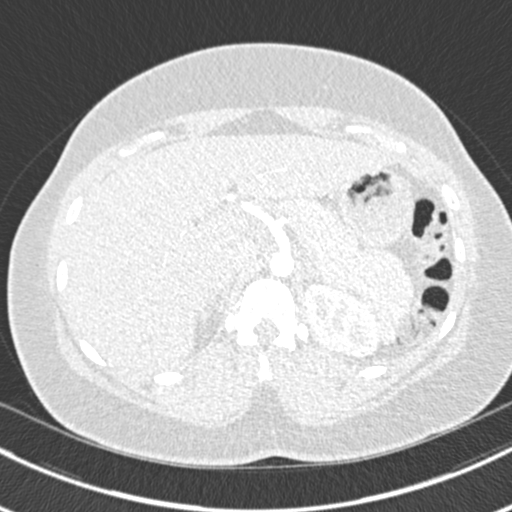
[im 38/273  soft-tissue]
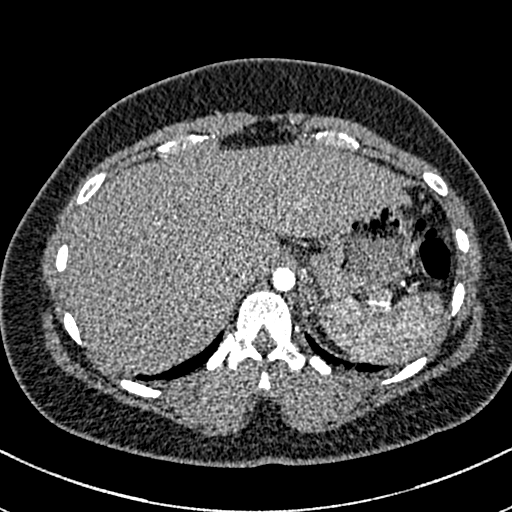
[im 50/273  lung]
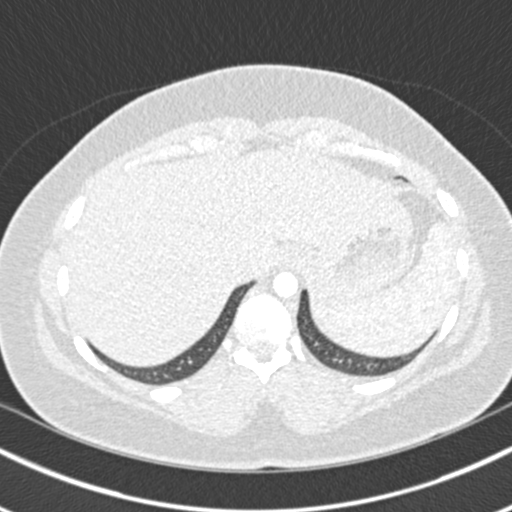
[im 62/273  soft-tissue]
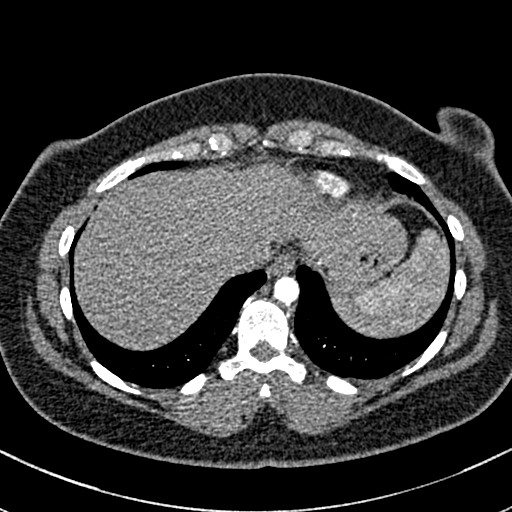
[im 87/273  lung]
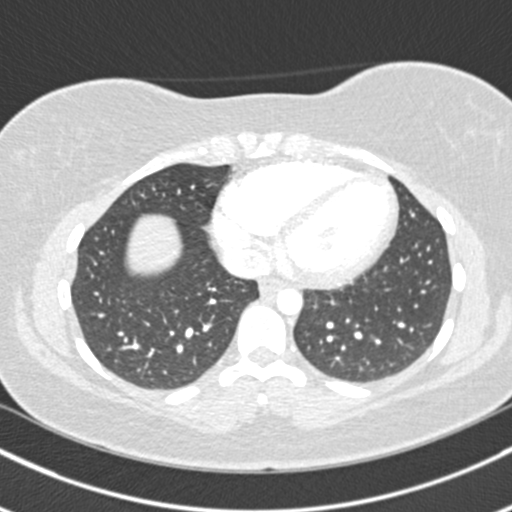
[im 99/273  soft-tissue]
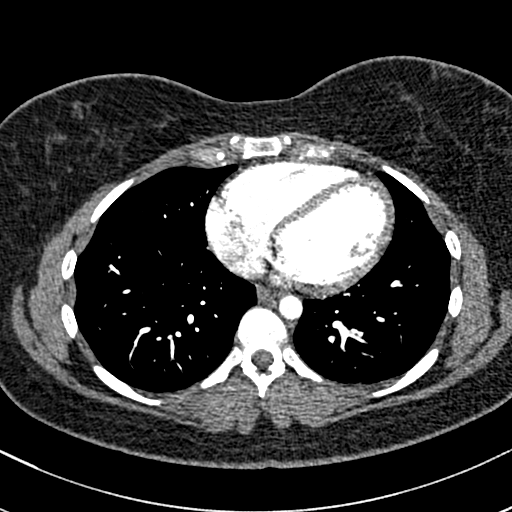
[im 124/273  lung]
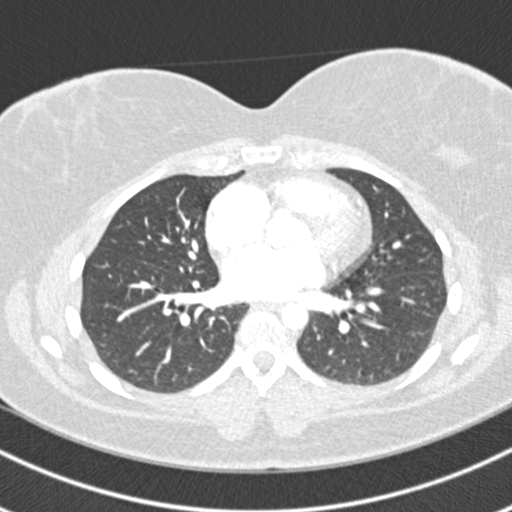
[im 137/273  soft-tissue]
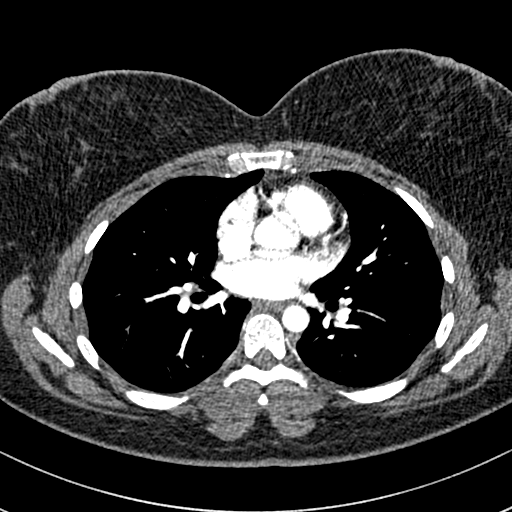
[im 149/273  lung]
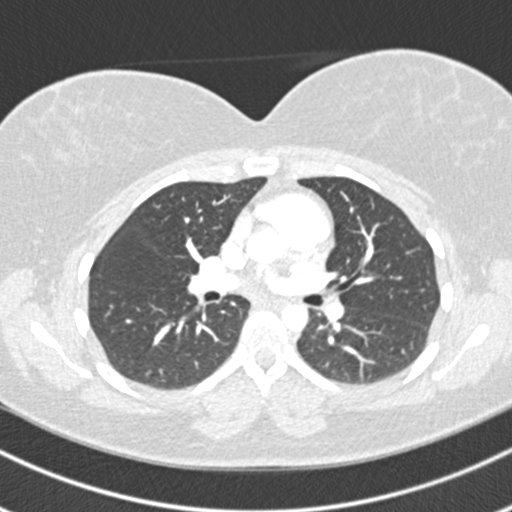
[im 174/273  soft-tissue]
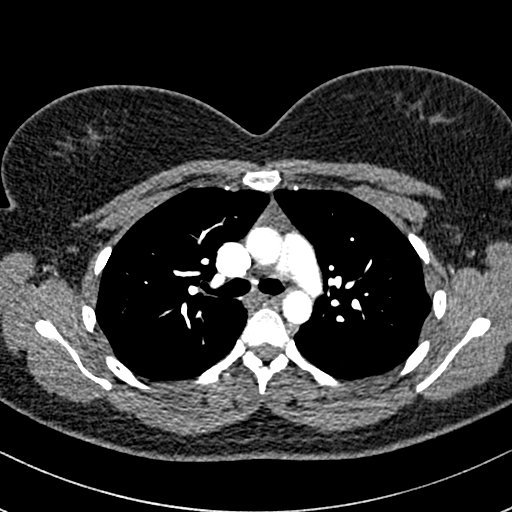
[im 186/273  lung]
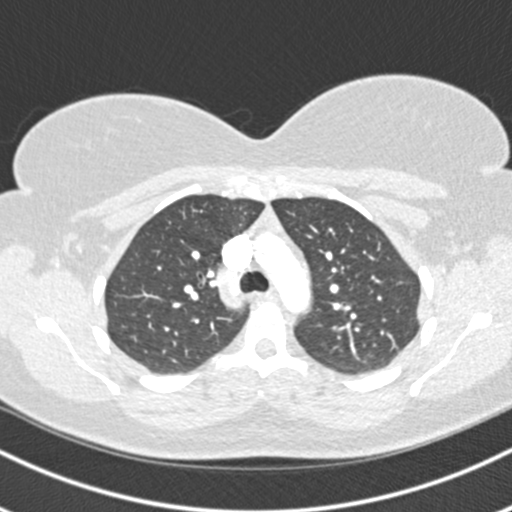
[im 211/273  soft-tissue]
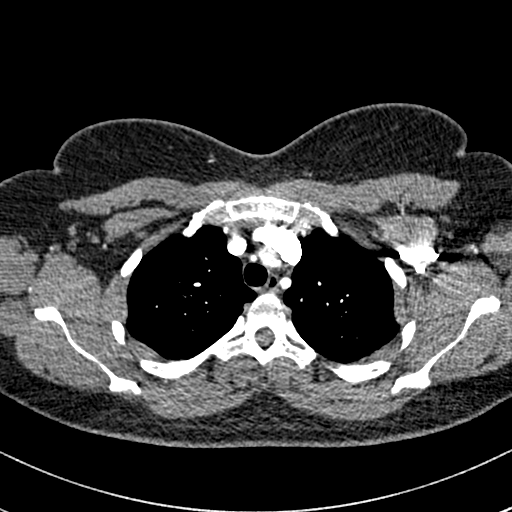
[im 223/273  lung]
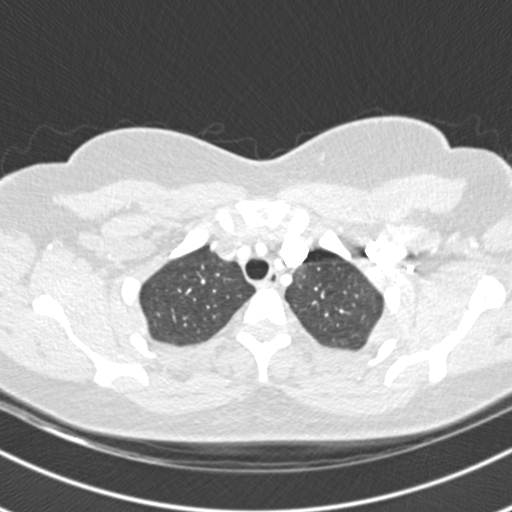
[im 235/273  soft-tissue]
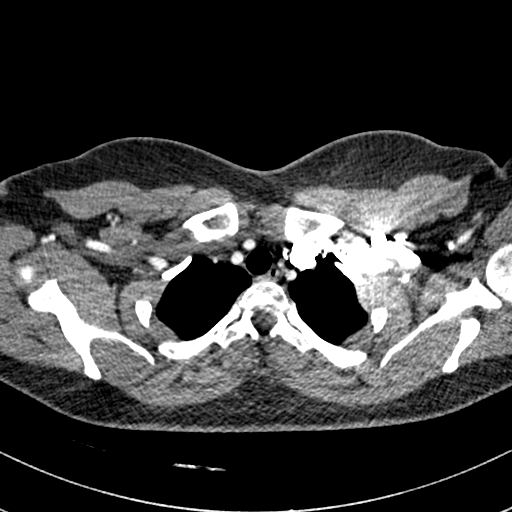
[im 260/273  lung]
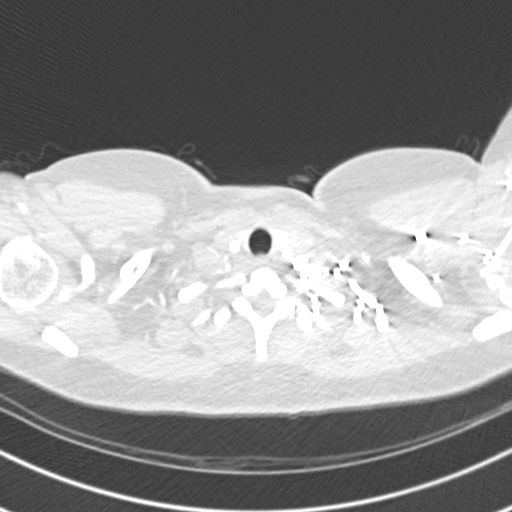

[Series 8: coronal mpr · coronal · 0.53mm/px · 3 of 101 slices shown]
[im 26/101  soft-tissue]
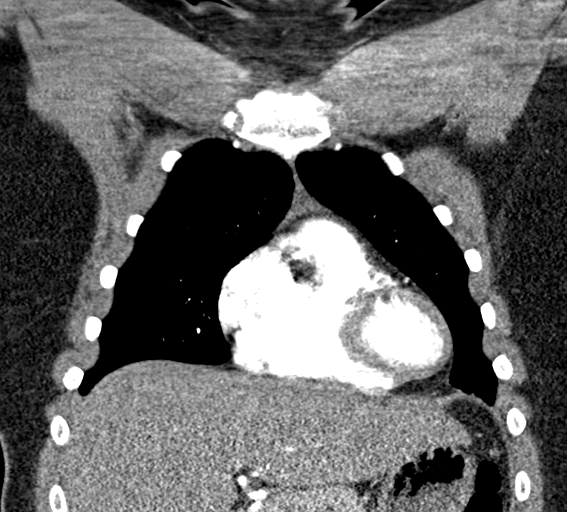
[im 51/101  soft-tissue]
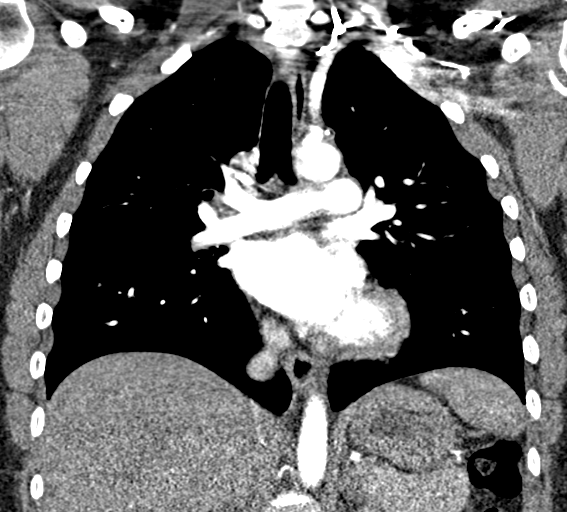
[im 76/101  soft-tissue]
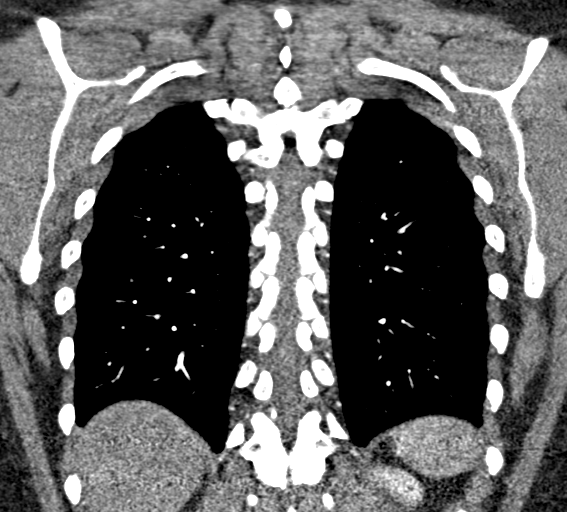

[18 of 46 positions shown; findings below may reference images not displayed]

FINDINGS: Cardiovascular:

--Pulmonary arteries: Contrast injection is sufficient to
demonstrate satisfactory opacification of the pulmonary arteries to
the segmental level. There is no pulmonary embolus. The main
pulmonary artery is within normal limits for size.

--Aorta: Satisfactory opacification of the thoracic aorta. No aortic
dissection or other acute aortic syndrome. Conventional 3 vessel
aortic branching pattern. The aortic course and caliber are normal.
There is no aortic atherosclerosis.

--Heart: Normal size. No pericardial effusion.

Mediastinum/Nodes: No mediastinal, hilar or axillary
lymphadenopathy. The visualized thyroid and thoracic esophageal
course are unremarkable.

Lungs/Pleura: No pulmonary nodules or masses. No pleural effusion or
pneumothorax. No focal airspace consolidation. No focal pleural
abnormality.

Upper Abdomen: Contrast bolus timing is not optimized for evaluation
of the abdominal organs. Within this limitation, the visualized
organs of the upper abdomen are normal.

Musculoskeletal: No chest wall abnormality. No acute or significant
osseous findings.

Review of the MIP images confirms the above findings.
IMPRESSION: Normal chest.  No pulmonary embolus.

## 2020-07-30 ENCOUNTER — Ambulatory Visit (INDEPENDENT_AMBULATORY_CARE_PROVIDER_SITE_OTHER): Payer: 59 | Admitting: Nurse Practitioner

## 2020-07-30 ENCOUNTER — Other Ambulatory Visit: Payer: Self-pay

## 2020-07-30 ENCOUNTER — Encounter: Payer: Self-pay | Admitting: Nurse Practitioner

## 2020-07-30 VITALS — BP 115/70 | HR 75 | Temp 97.7°F | Ht 65.0 in | Wt 203.2 lb

## 2020-07-30 DIAGNOSIS — Z0001 Encounter for general adult medical examination with abnormal findings: Secondary | ICD-10-CM | POA: Diagnosis not present

## 2020-07-30 DIAGNOSIS — E559 Vitamin D deficiency, unspecified: Secondary | ICD-10-CM

## 2020-07-30 DIAGNOSIS — F339 Major depressive disorder, recurrent, unspecified: Secondary | ICD-10-CM | POA: Insufficient documentation

## 2020-07-30 DIAGNOSIS — Z13220 Encounter for screening for lipoid disorders: Secondary | ICD-10-CM

## 2020-07-30 DIAGNOSIS — Z136 Encounter for screening for cardiovascular disorders: Secondary | ICD-10-CM

## 2020-07-30 DIAGNOSIS — F332 Major depressive disorder, recurrent severe without psychotic features: Secondary | ICD-10-CM

## 2020-07-30 DIAGNOSIS — H6123 Impacted cerumen, bilateral: Secondary | ICD-10-CM | POA: Insufficient documentation

## 2020-07-30 DIAGNOSIS — F32A Depression, unspecified: Secondary | ICD-10-CM | POA: Insufficient documentation

## 2020-07-30 HISTORY — DX: Impacted cerumen, bilateral: H61.23

## 2020-07-30 LAB — COMPREHENSIVE METABOLIC PANEL
ALT: 7 U/L (ref 0–35)
AST: 12 U/L (ref 0–37)
Albumin: 3.9 g/dL (ref 3.5–5.2)
Alkaline Phosphatase: 55 U/L (ref 39–117)
BUN: 12 mg/dL (ref 6–23)
CO2: 30 mEq/L (ref 19–32)
Calcium: 9.2 mg/dL (ref 8.4–10.5)
Chloride: 104 mEq/L (ref 96–112)
Creatinine, Ser: 1.09 mg/dL (ref 0.40–1.20)
GFR: 65.86 mL/min (ref >60.00)
Glucose, Bld: 84 mg/dL (ref 70–99)
Potassium: 4.2 mEq/L (ref 3.5–5.1)
Sodium: 140 mEq/L (ref 135–145)
Total Bilirubin: 0.5 mg/dL (ref 0.2–1.2)
Total Protein: 7 g/dL (ref 6.0–8.3)

## 2020-07-30 LAB — VITAMIN D 25 HYDROXY (VIT D DEFICIENCY, FRACTURES): VITD: <7 ng/mL — ABNORMAL LOW (ref 30.00–100.00)

## 2020-07-30 LAB — CBC WITH DIFFERENTIAL/PLATELET
Basophils Absolute: 0 10*3/uL (ref 0.0–0.1)
Basophils Relative: 0.1 % (ref 0.0–3.0)
Eosinophils Absolute: 0.2 10*3/uL (ref 0.0–0.7)
Eosinophils Relative: 3.5 % (ref 0.0–5.0)
HCT: 39.2 % (ref 36.0–46.0)
Hemoglobin: 13 g/dL (ref 12.0–15.0)
Lymphocytes Relative: 23.7 % (ref 12.0–46.0)
Lymphs Abs: 1.7 10*3/uL (ref 0.7–4.0)
MCHC: 33.2 g/dL (ref 30.0–36.0)
MCV: 90.7 fl (ref 78.0–100.0)
Monocytes Absolute: 0.5 10*3/uL (ref 0.1–1.0)
Monocytes Relative: 7.3 % (ref 3.0–12.0)
Neutro Abs: 4.7 10*3/uL (ref 1.4–7.7)
Neutrophils Relative %: 65.4 % (ref 43.0–77.0)
Platelets: 247 10*3/uL (ref 150.0–400.0)
RBC: 4.32 Mil/uL (ref 3.87–5.11)
RDW: 13.6 % (ref 11.5–15.5)
WBC: 7.2 10*3/uL (ref 4.0–10.5)

## 2020-07-30 LAB — LIPID PANEL
Cholesterol: 151 mg/dL (ref 0–200)
HDL: 44 mg/dL (ref >39.00)
LDL Cholesterol: 94 mg/dL (ref 0–99)
NonHDL: 107.13
Total CHOL/HDL Ratio: 3
Triglycerides: 67 mg/dL (ref 0.0–149.0)
VLDL: 13.4 mg/dL (ref 0.0–40.0)

## 2020-07-30 LAB — TSH: TSH: 1.82 u[IU]/mL (ref 0.35–4.50)

## 2020-07-30 MED ORDER — VITAMIN D (ERGOCALCIFEROL) 1.25 MG (50000 UNIT) PO CAPS: 50000.0000 [IU] | ORAL_CAPSULE | ORAL | 0 refills | Status: DC

## 2020-07-30 MED ORDER — FLUOXETINE HCL 20 MG PO TABS: 20.0000 mg | ORAL_TABLET | Freq: Every day | ORAL | 5 refills | Status: DC

## 2020-07-30 NOTE — Assessment & Plan Note (Signed)
Onset over 1year ago, worse in last 23months, affecting personal relationships. No SI/HI/hallucination No previous IVC or suicide attempt. No ETOH or tobaaco or illicit drug use. No head injury.  Start prozac: Start 1/2 tablet once daily for 1 week and then increase to a full tablet once daily continuously. We discussed common side effects such as nausea, drowsiness and weight gain.  Also discussed rare but serious side effect of suicide ideation.  She is instructed to discontinue medication go directly to ED if this occurs.  Pt verbalizes understanding.   Entered referral to psychologist F/up in 12months

## 2020-07-30 NOTE — Assessment & Plan Note (Signed)
Reports ear fullness and decreased hearing. Procedure Note :   Procedure : cerumen removal  Indication:  Cerumen impaction  Risks, including pain, dizziness, eardrum perforation, bleeding, infection and others as well as benefits were explained to the patient in detail. Verbal consent was obtained and the patient agreed to proceed.   We used currette to remove A large amount wax from bilateral ear canals.  Tolerated well. Complications: None.  Postprocedure instructions :  Call if problems.  Normal TM, ear canal after procedure

## 2020-07-30 NOTE — Patient Instructions (Addendum)
Start fluoxetine 1/2 tablet once daily for 1 week and then increase to a full tablet once daily continuously. We discussed common side effects such as nausea, drowsiness and weight gain.  Also discussed rare but serious side effect of suicide ideation.  She is instructed to discontinue medication go directly to ED if this occurs.  Pt verbalizes understanding.   Plan follow up in 1 month to evaluate progress.    Go to lab for blood draw You will be contacted to schedule appt with psychologist  Last PAP smear 2019: normal, need repeat this year, schedule with GYN  Managing Depression, Adult Depression is a mental health condition that affects your thoughts, feelings, and actions. Being diagnosed with depression can bring you relief if you did not know why you have felt or behaved a certain way. It could also leave you feeling overwhelmed with uncertainty about your future. Preparing yourself to manage your symptoms can help you feel more positive about your future. How to manage lifestyle changes Managing stress Stress is your body's reaction to life changes and events, both good and bad. Stress can add to your feelings of depression. Learning to manage your stress can help lessen your feelings of depression. Try some of the following approaches to reducing your stress (stress reduction techniques):  Listen to music that you enjoy and that inspires you.  Try using a meditation app or take a meditation class.  Develop a practice that helps you connect with your spiritual self. Walk in nature, pray, or go to a place of worship.  Do some deep breathing. To do this, inhale slowly through your nose. Pause at the top of your inhale for a few seconds and then exhale slowly, letting your muscles relax.  Practice yoga to help relax and work your muscles. Choose a stress reduction technique that suits your lifestyle and personality. These techniques take time and practice to develop. Set aside 5-15  minutes a day to do them. Therapists can offer training in these techniques. Other things you can do to manage stress include:  Keeping a stress diary.  Knowing your limits and saying no when you think something is too much.  Paying attention to how you react to certain situations. You may not be able to control everything, but you can change your reaction.  Adding humor to your life by watching funny films or TV shows.  Making time for activities that you enjoy and that relax you.   Medicines Medicines, such as antidepressants, are often a part of treatment for depression.  Talk with your pharmacist or health care provider about all the medicines, supplements, and herbal products that you take, their possible side effects, and what medicines and other products are safe to take together.  Make sure to report any side effects you may have to your health care provider. Relationships Your health care provider may suggest family therapy, couples therapy, or individual therapy as part of your treatment. How to recognize changes Everyone responds differently to treatment for depression. As you recover from depression, you may start to:  Have more interest in doing activities.  Feel less hopeless.  Have more energy.  Overeat less often, or have a better appetite.  Have better mental focus. It is important to recognize if your depression is not getting better or is getting worse. The symptoms you had in the beginning may return, such as:  Tiredness (fatigue) or low energy.  Eating too much or too little.  Sleeping too much  or too little.  Feeling restless, agitated, or hopeless.  Trouble focusing or making decisions.  Unexplained physical complaints.  Feeling irritable, angry, or aggressive. If you or your family members notice these symptoms coming back, let your health care provider know right away. Follow these instructions at home: Activity  Try to get some form of  exercise each day, such as walking, biking, swimming, or lifting weights.  Practice stress reduction techniques.  Engage your mind by taking a class or doing some volunteer work.   Lifestyle  Get the right amount and quality of sleep.  Cut down on using caffeine, tobacco, alcohol, and other potentially harmful substances.  Eat a healthy diet that includes plenty of vegetables, fruits, whole grains, low-fat dairy products, and lean protein. Do not eat a lot of foods that are high in solid fats, added sugars, or salt (sodium). General instructions  Take over-the-counter and prescription medicines only as told by your health care provider.  Keep all follow-up visits as told by your health care provider. This is important. Where to find support Talking to others Friends and family members can be sources of support and guidance. Talk to trusted friends or family members about your condition. Explain your symptoms to them, and let them know that you are working with a health care provider to treat your depression. Tell friends and family members how they also can be helpful.   Finances  Find appropriate mental health providers that fit with your financial situation.  Talk with your health care provider about options to get reduced prices on your medicines. Where to find more information You can find support in your area from:  Anxiety and Depression Association of America (ADAA): www.adaa.org  Mental Health America: www.mentalhealthamerica.net  [employer] on Mental Illness: www.nami.org Contact a health care provider if:  You stop taking your antidepressant medicines, and you have any of these symptoms: ? Nausea. ? Headache. ? Light-headedness. ? Chills and body aches. ? Not being able to sleep (insomnia).  You or your friends and family think your depression is getting worse. Get help right away if:  You have thoughts of hurting yourself or others. If you ever feel  like you may hurt yourself or others, or have thoughts about taking your own life, get help right away. Go to your nearest emergency department or:  Call your local emergency services (911 in the [place]  Call a suicide crisis helpline, such as the National Suicide Prevention Lifeline at 412-057-7289. This is open 24 hours a day in the [place]  Text the Crisis Text Line at 412-618-5248 (in the [place] Summary  If you are diagnosed with depression, preparing yourself to manage your symptoms is a good way to feel positive about your future.  Work with your health care provider on a management plan that includes stress reduction techniques, medicines (if applicable), therapy, and healthy lifestyle habits.  Keep talking with your health care provider about how your treatment is working.  If you have thoughts about taking your own life, call a suicide crisis helpline or text a crisis text line. This information is not intended to replace advice given to you by your health care provider. Make sure you discuss any questions you have with your health care provider. Document Revised: 04/10/2019 Document Reviewed: 04/10/2019 Elsevier Patient Education  2021 [employer].

## 2020-07-30 NOTE — Progress Notes (Signed)
Subjective:    Patient ID: Wanda Hammond, female    DOB: 28-Mar-1986, 35 y.o.   MRN: [id number]  Patient presents today for CPE and  eval of chronic conditions HPI Severe episode of recurrent major depressive disorder, without psychotic features (HCC) Onset over 1year ago, worse in last 4months, affecting personal relationships. No SI/HI/hallucination No previous IVC or suicide attempt. No ETOH or tobaaco or illicit drug use. No head injury.  Start prozac: Start 1/2 tablet once daily for 1 week and then increase to a full tablet once daily continuously. We discussed common side effects such as nausea, drowsiness and weight gain.  Also discussed rare but serious side effect of suicide ideation.  She is instructed to discontinue medication go directly to ED if this occurs.  Pt verbalizes understanding.   Entered referral to psychologist F/up in 67months  Hx of abnormal cervical Papanicolaou smear Normal PAP in 2019 with negative HPV  Ceruminosis, bilateral Reports ear fullness and decreased hearing. Procedure Note :   Procedure : cerumen removal  Indication:  Cerumen impaction  Risks, including pain, dizziness, eardrum perforation, bleeding, infection and others as well as benefits were explained to the patient in detail. Verbal consent was obtained and the patient agreed to proceed.   We used currette to remove A large amount wax from bilateral ear canals.  Tolerated well. Complications: None.  Postprocedure instructions :  Call if problems.  Normal TM, ear canal after procedure  Depression/Suicide: Depression screen [facility] 2/9 07/30/2020 07/18/2018 12/21/2017 01/11/2015 12/03/2014  Decreased Interest 2 0 0 0 0  Down, Depressed, Hopeless 2 0 0 0 0  PHQ - 2 Score 4 0 0 0 0  Altered sleeping 3 - - - -  Tired, decreased energy 3 - - - -  Change in appetite 0 - - - -  Feeling bad or failure about yourself  2 - - - -  Trouble concentrating 0 - - - -  Moving slowly or  fidgety/restless 0 - - - -  Suicidal thoughts 1 - - - -  PHQ-9 Score 13 - - - -  Difficult doing work/chores Very difficult - - - -   Vision:up to date  Dental:up to date  Immunizations: (TDAP, Hep C screen, Pneumovax, Influenza, zoster)  Health Maintenance  Topic Date Due  . COVID-19 Vaccine (3 - Booster for Moderna series) 03/12/2020  .  Hepatitis C: One time screening is recommended by Center for Disease Control  (CDC) for  adults born from [birth year] through 1966.   07/30/2021*  . Pap Smear  12/21/2020  . Tetanus Vaccine  06/02/2026  . Flu Shot  Completed  . HIV Screening  Completed  *Topic was postponed. The date shown is not the original due date.   Diet:regular. Exercise: none Weight:  Wt Readings from Last 3 Encounters:  07/30/20 203 lb 3.2 oz (92.2 kg)  11/18/19 213 lb 12.8 oz (97 kg)  07/18/18 200 lb (90.7 kg)   Fall Risk: Fall Risk  07/30/2020 07/18/2018 12/21/2017 01/11/2015  Falls in the past year? 0 0 No No   Medications and allergies reviewed with patient and updated if appropriate.  Patient Active Problem List   Diagnosis Date Noted  . Severe episode of recurrent major depressive disorder, without psychotic features (HCC) 07/30/2020  . Ceruminosis, bilateral 07/30/2020  . Hx of abnormal cervical Papanicolaou smear 12/21/2017  . Atypical squamous cells of undetermined significance on cytologic smear of cervix (ASC-US) 12/08/2015  . Allergy to latex  11/30/2015  . Anemia 01/02/2012  . Irregular periods/menstrual cycles 09/23/2011  . Family history of Downs syndrome 09/23/2011    Current Outpatient Medications on File Prior to Visit  Medication Sig Dispense Refill  . albuterol (PROVENTIL HFA;VENTOLIN HFA) 108 (90 Base) MCG/ACT inhaler Inhale 1-2 puffs into the lungs every 6 (six) hours as needed. 1 Inhaler 0  . levonorgestrel (MIRENA) 20 MCG/24HR IUD Mirena 20 mcg/24 hours (5 yrs) 52 mg intrauterine device  Take 1 insert by intrauterine route.     No current  facility-administered medications on file prior to visit.    Past Medical History:  Diagnosis Date  . Chicken pox   . H/O varicella   . Laceration of vaginal wall or sulcus without perineal laceration during delivery 02/19/2012   2nd degree vaginal laceration; just left of pt's ML.  Repaired w/ 3 interrupted stitches  . No pertinent past medical history   . NSVD (normal spontaneous vaginal delivery) 02/19/2012  . UTI (urinary tract infection)    No past surgical history on file.  Social History   Socioeconomic History  . Marital status: Married    Spouse name: Not on file  . Number of children: 1  . Years of education: Not on file  . Highest education level: Not on file  Occupational History  . Not on file  Tobacco Use  . Smoking status: Never Smoker  . Smokeless tobacco: Never Used  Vaping Use  . Vaping Use: Never used  Substance and Sexual Activity  . Alcohol use: Yes    Comment: once in a while  . Drug use: No  . Sexual activity: Yes    Birth control/protection: I.U.D.  Other Topics Concern  . Not on file  Social History Narrative  . Not on file   Social Determinants of Health   Financial Resource Strain: Not on file  Food Insecurity: Not on file  Transportation Needs: Not on file  Physical Activity: Not on file  Stress: Not on file  Social Connections: Not on file    Family History  Problem Relation Age of Onset  . Diabetes Maternal Grandmother   . Down syndrome Brother   . Diabetes Mother   . Hypertension Mother   . Arthritis Mother   . Cancer Father        lung  . Alcohol abuse Father   . Asthma Father   . Hypertension Father   . Anesthesia problems Neg Hx        Review of Systems  Constitutional: Negative for fever, malaise/fatigue and weight loss.  HENT: Positive for ear pain and hearing loss. Negative for congestion and sore throat.   Eyes:       Negative for visual changes  Respiratory: Negative for cough and shortness of breath.    Cardiovascular: Negative for chest pain, palpitations and leg swelling.  Gastrointestinal: Negative for blood in stool, constipation, diarrhea and heartburn.  Genitourinary: Negative for dysuria, frequency and urgency.  Musculoskeletal: Negative for falls, joint pain and myalgias.  Skin: Negative for rash.  Neurological: Negative for dizziness, sensory change and headaches.  Endo/Heme/Allergies: Does not bruise/bleed easily.  Psychiatric/Behavioral: Positive for depression. Negative for hallucinations, memory loss, substance abuse and suicidal ideas. The patient is nervous/anxious. The patient does not have insomnia.     Objective:   Vitals:   07/30/20 0905  BP: 115/70  Pulse: 75  Temp: 97.7 F (36.5 C)  SpO2: 97%    Body mass index is 33.81 kg/m.  Physical Examination:  Physical Exam Vitals reviewed.  Constitutional:      General: She is not in acute distress.    Appearance: She is well-developed. She is obese.  HENT:     Right Ear: External ear normal. There is impacted cerumen.     Left Ear: External ear normal. There is impacted cerumen.     Mouth/Throat:     Mouth: Oropharynx is clear and moist.  Eyes:     Extraocular Movements: Extraocular movements intact and EOM normal.     Conjunctiva/sclera: Conjunctivae normal.  Cardiovascular:     Rate and Rhythm: Normal rate and regular rhythm.     Heart sounds: Normal heart sounds.  Pulmonary:     Effort: Pulmonary effort is normal. No respiratory distress.     Breath sounds: Normal breath sounds.  Chest:     Chest wall: No tenderness.  Breasts: Breasts are symmetrical.     Right: No axillary adenopathy or supraclavicular adenopathy.     Left: No axillary adenopathy or supraclavicular adenopathy.    Abdominal:     General: Bowel sounds are normal.     Palpations: Abdomen is soft.  Genitourinary:    Comments: Deferred to GYN per patient Musculoskeletal:        General: No edema. Normal range of motion.      Cervical back: Normal range of motion and neck supple.  Lymphadenopathy:     Cervical: No cervical adenopathy.     Upper Body:     Right upper body: No supraclavicular, axillary or pectoral adenopathy.     Left upper body: No supraclavicular, axillary or pectoral adenopathy.  Neurological:     Mental Status: She is alert and oriented to person, place, and time.     Deep Tendon Reflexes: Reflexes are normal and symmetric.  Psychiatric:        Attention and Perception: Attention normal.        Mood and Affect: Mood is depressed. Affect is tearful.        Speech: Speech normal.        Behavior: Behavior is cooperative.        Thought Content: Thought content normal.        Cognition and Memory: Cognition normal.        Judgment: Judgment normal.    ASSESSMENT and PLAN: This visit occurred during the SARS-CoV-2 public health emergency.  Safety protocols were in place, including screening questions prior to the visit, additional usage of staff PPE, and extensive cleaning of exam room while observing appropriate contact time as indicated for disinfecting solutions.   Liliya was seen today for annual exam.  Diagnoses and all orders for this visit:  Encounter for preventative adult health care exam with abnormal findings -     CBC with Differential/Platelet -     Comprehensive metabolic panel -     Lipid panel  Encounter for lipid screening for cardiovascular disease -     Lipid panel  Severe episode of recurrent major depressive disorder, without psychotic features (HCC) -     TSH -     FLUoxetine (PROZAC) 20 MG tablet; Take 1 tablet (20 mg total) by mouth daily. -     Ambulatory referral to Psychology  Vitamin D insufficiency -     VITAMIN D 25 Hydroxy (Vit-D Deficiency, Fractures)  Ceruminosis, bilateral      Problem List Items Addressed This Visit      Nervous and Auditory   Ceruminosis, bilateral  Reports ear fullness and decreased hearing. Procedure Note :    Procedure : cerumen removal  Indication:  Cerumen impaction  Risks, including pain, dizziness, eardrum perforation, bleeding, infection and others as well as benefits were explained to the patient in detail. Verbal consent was obtained and the patient agreed to proceed.   We used currette to remove A large amount wax from bilateral ear canals.  Tolerated well. Complications: None.  Postprocedure instructions :  Call if problems.  Normal TM, ear canal after procedure        Other   Severe episode of recurrent major depressive disorder, without psychotic features (HCC)    Onset over 1year ago, worse in last 3months, affecting personal relationships. No SI/HI/hallucination No previous IVC or suicide attempt. No ETOH or tobaaco or illicit drug use. No head injury.  Start prozac: Start 1/2 tablet once daily for 1 week and then increase to a full tablet once daily continuously. We discussed common side effects such as nausea, drowsiness and weight gain.  Also discussed rare but serious side effect of suicide ideation.  She is instructed to discontinue medication go directly to ED if this occurs.  Pt verbalizes understanding.   Entered referral to psychologist F/up in 1months      Relevant Medications   FLUoxetine (PROZAC) 20 MG tablet   Other Relevant Orders   TSH (Completed)   Ambulatory referral to Psychology    Other Visit Diagnoses    Encounter for preventative adult health care exam with abnormal findings    -  Primary   Relevant Orders   CBC with Differential/Platelet (Completed)   Comprehensive metabolic panel (Completed)   Lipid panel (Completed)   Encounter for lipid screening for cardiovascular disease       Relevant Orders   Lipid panel (Completed)   Vitamin D insufficiency       Relevant Orders   VITAMIN D 25 Hydroxy (Vit-D Deficiency, Fractures) (Completed)      Follow up: Return in about 4 weeks (around 08/27/2020) for depression and anxiety (30mins).  Alysia Pennaharlotte  Kanna Dafoe, NP

## 2020-07-30 NOTE — Assessment & Plan Note (Signed)
Normal labs except extremely low vitamin D. Start vitmain D 50,000IU week x 70months. New rx sent

## 2020-07-30 NOTE — Assessment & Plan Note (Signed)
Normal PAP in 2019 with negative HPV

## 2020-07-30 NOTE — Addendum Note (Signed)
Addended by: Michaela Corner on: 07/30/2020 01:43 PM   Modules accepted: Orders

## 2020-08-31 ENCOUNTER — Other Ambulatory Visit: Payer: Self-pay

## 2020-08-31 ENCOUNTER — Ambulatory Visit (INDEPENDENT_AMBULATORY_CARE_PROVIDER_SITE_OTHER): Payer: 59 | Admitting: Nurse Practitioner

## 2020-08-31 ENCOUNTER — Encounter: Payer: Self-pay | Admitting: Nurse Practitioner

## 2020-08-31 VITALS — BP 120/70 | HR 78 | Temp 97.0°F | Ht 64.5 in | Wt 201.8 lb

## 2020-08-31 DIAGNOSIS — F332 Major depressive disorder, recurrent severe without psychotic features: Secondary | ICD-10-CM | POA: Diagnosis not present

## 2020-08-31 MED ORDER — FLUOXETINE HCL 20 MG PO TABS: 40.0000 mg | ORAL_TABLET | Freq: Every day | ORAL | 5 refills | Status: DC

## 2020-08-31 NOTE — Patient Instructions (Signed)
Increase fluoxetine dose to 2tabs daily (40mg ) Schedule appt with psychologist when possible Continue journaling and daily exercise. F/up in 3month

## 2020-08-31 NOTE — Assessment & Plan Note (Signed)
Stable mood with fluoxetine Waiting for appt with therapist. Denies any adverse side effects with current medications She has also incorporated daily walks x 30-52mins and journaling.  Increase fluoxetine to 40mg  F/up in 53month

## 2020-08-31 NOTE — Progress Notes (Signed)
Subjective:  Patient ID: Wanda Hammond, female    DOB: [birth year]  Age: 35 y.o. MRN: [id number]  CC: Follow-up (4 week f/u on anxiety and depression. Pt state she does not know if medication is working she can't really tell a difference. )  HPI Depression screen [facility] 2/9 08/31/2020 07/30/2020 07/18/2018  Decreased Interest 1 2 0  Down, Depressed, Hopeless 2 2 0  PHQ - 2 Score 3 4 0  Altered sleeping 2 3 -  Tired, decreased energy 3 3 -  Change in appetite 3 0 -  Feeling bad or failure about yourself  2 2 -  Trouble concentrating 1 0 -  Moving slowly or fidgety/restless 2 0 -  Suicidal thoughts 0 1 -  PHQ-9 Score 16 13 -  Difficult doing work/chores Very difficult Very difficult -   GAD 7 : Generalized Anxiety Score 08/31/2020 07/30/2020  Nervous, Anxious, on Edge 1 3  Control/stop worrying 3 3  Worry too much - different things 2 2  Trouble relaxing 2 1  Restless 2 1  Easily annoyed or irritable 3 3  Afraid - awful might happen 2 1  Total GAD 7 Score 15 14  Anxiety Difficulty Very difficult Very difficult   Reviewed past Medical, Social and Family history today.  Outpatient Medications Prior to Visit  Medication Sig Dispense Refill  . albuterol (PROVENTIL HFA;VENTOLIN HFA) 108 (90 Base) MCG/ACT inhaler Inhale 1-2 puffs into the lungs every 6 (six) hours as needed. 1 Inhaler 0  . levonorgestrel (MIRENA) 20 MCG/24HR IUD Mirena 20 mcg/24 hours (5 yrs) 52 mg intrauterine device  Take 1 insert by intrauterine route.    . Vitamin D, Ergocalciferol, (DRISDOL) 1.25 MG (50000 UNIT) CAPS capsule Take 1 capsule (50,000 Units total) by mouth every 7 (seven) days. 12 capsule 0  . FLUoxetine (PROZAC) 20 MG tablet Take 1 tablet (20 mg total) by mouth daily. 30 tablet 5   No facility-administered medications prior to visit.    ROS See HPI  Objective:  BP 120/70 (BP Location: Left Arm, Patient Position: Sitting, Cuff Size: Normal)   Pulse 78   Temp (!) 97 F (36.1 C) (Temporal)    Ht 5' 4.5" (1.638 m)   Wt 201 lb 12.8 oz (91.5 kg)   SpO2 98%   BMI 34.10 kg/m   Physical Exam Psychiatric:        Attention and Perception: Attention normal.        Mood and Affect: Affect is flat.        Speech: Speech normal.        Behavior: Behavior is cooperative.        Thought Content: Thought content is not paranoid or delusional. Thought content does not include homicidal or suicidal ideation. Thought content does not include homicidal or suicidal plan.        Cognition and Memory: Cognition normal.        Judgment: Judgment normal.    Assessment & Plan:  This visit occurred during the SARS-CoV-2 public health emergency.  Safety protocols were in place, including screening questions prior to the visit, additional usage of staff PPE, and extensive cleaning of exam room while observing appropriate contact time as indicated for disinfecting solutions.   Karl was seen today for follow-up.  Diagnoses and all orders for this visit:  Severe episode of recurrent major depressive disorder, without psychotic features (HCC) -     FLUoxetine (PROZAC) 20 MG tablet; Take 2 tablets (40 mg total)  by mouth daily.   Problem List Items Addressed This Visit      Other   Severe episode of recurrent major depressive disorder, without psychotic features (HCC) - Primary    Stable mood with fluoxetine Waiting for appt with therapist. Denies any adverse side effects with current medications She has also incorporated daily walks x 30-72mins and journaling.  Increase fluoxetine to 40mg  F/up in 72month      Relevant Medications   FLUoxetine (PROZAC) 20 MG tablet      Follow-up: Return in about 4 weeks (around 09/28/2020) for anxiety and depression.  09/30/2020, NP

## 2020-09-06 ENCOUNTER — Encounter: Payer: Self-pay | Admitting: Family

## 2020-09-06 ENCOUNTER — Telehealth: Payer: 59 | Admitting: Family

## 2020-09-06 DIAGNOSIS — K130 Diseases of lips: Secondary | ICD-10-CM

## 2020-09-06 MED ORDER — TRIAMCINOLONE ACETONIDE 0.1 % EX CREA: 1.0000 "application " | TOPICAL_CREAM | Freq: Two times a day (BID) | CUTANEOUS | 0 refills | Status: DC

## 2020-09-06 MED ORDER — CETIRIZINE HCL 10 MG PO TABS: 10.0000 mg | ORAL_TABLET | Freq: Every day | ORAL | 11 refills | Status: DC

## 2020-09-06 NOTE — Progress Notes (Addendum)
   Virtual Visit  Note Due to COVID-19 pandemic this visit was conducted virtually. This visit type was conducted due to national recommendations for restrictions regarding the COVID-19 Pandemic (e.g. social distancing, sheltering in place) in an effort to limit this patient's exposure and mitigate transmission in our community. All issues noted in this document were discussed and addressed.  A physical exam was not performed with this format.  I connected with Wanda Hammond on 09/06/20 at 1:46 pm by video and verified that I am speaking with the correct person using two identifiers. Wanda Hammond is currently located at basketball practice and no one is currently with her during visit. The provider, Jannifer Rodney, FNP is located in their office at time of visit.  I discussed the limitations, risks, security and privacy concerns of performing an evaluation and management service by video and the availability of in person appointments. I also discussed with the patient that there may be a patient responsible charge related to this service. The patient expressed understanding and agreed to proceed.   History and Present Illness:  HPI  Pt presents to day with an allergic reaction that started 4 days and is becoming worse. The rash started on her upper lip and has spread and spread around her lips. She reports the area is slightly swollen.  Denies any itching, fever, redness, or heat. She states the area is tender when moving her mouth. She denies any new foods, make-up, lipstick, or chapstick.   She has not taken anything OTC yet.   Denies any tongue swelling or SOB.     Review of Systems  Skin: Positive for rash.  All other systems reviewed and are negative.    Observations/Objective: No SOB or distress noted, upper lip slightly swollen, papule rash around upper and lower lip.   Assessment and Plan: 1. Rash on lips Avoid allergens  Try write down any changes in  diet, drinks, or make up Avoid scratching  If any tongue swelling or SOB go to ED Benadryl as needed If symptoms continue or do not improve, may need oral steroids  - triamcinolone (KENALOG) 0.1 %; Apply 1 application topically 2 (two) times daily.  Dispense: 30 g; Refill: 0 - cetirizine (ZYRTEC) 10 MG tablet; Take 1 tablet (10 mg total) by mouth daily.  Dispense: 30 tablet; Refill: 11     I discussed the assessment and treatment plan with the patient. The patient was provided an opportunity to ask questions and all were answered. The patient agreed with the plan and demonstrated an understanding of the instructions.   The patient was advised to call back or seek an in-person evaluation if the symptoms worsen or if the condition fails to improve as anticipated.  The above assessment and management plan was discussed with the patient. The patient verbalized understanding of and has agreed to the management plan. Patient is aware to call the clinic if symptoms persist or worsen. Patient is aware when to return to the clinic for a follow-up visit. Patient educated on when it is appropriate to go to the emergency department.   Time call ended:  2:00 pm  I provided 14 minutes of  face-to-face time during this encounter.    Jannifer Rodney, FNP

## 2020-09-30 ENCOUNTER — Ambulatory Visit (INDEPENDENT_AMBULATORY_CARE_PROVIDER_SITE_OTHER): Payer: 59 | Admitting: Nurse Practitioner

## 2020-09-30 ENCOUNTER — Encounter: Payer: Self-pay | Admitting: Nurse Practitioner

## 2020-09-30 ENCOUNTER — Other Ambulatory Visit: Payer: Self-pay

## 2020-09-30 VITALS — BP 92/68 | HR 79 | Temp 98.5°F | Ht 64.5 in | Wt 205.0 lb

## 2020-09-30 DIAGNOSIS — E538 Deficiency of other specified B group vitamins: Secondary | ICD-10-CM

## 2020-09-30 DIAGNOSIS — F332 Major depressive disorder, recurrent severe without psychotic features: Secondary | ICD-10-CM | POA: Diagnosis not present

## 2020-09-30 DIAGNOSIS — R5382 Chronic fatigue, unspecified: Secondary | ICD-10-CM | POA: Diagnosis not present

## 2020-09-30 DIAGNOSIS — E559 Vitamin D deficiency, unspecified: Secondary | ICD-10-CM | POA: Diagnosis not present

## 2020-09-30 LAB — VITAMIN B12: Vitamin B-12: 208 pg/mL — ABNORMAL LOW (ref 211–911)

## 2020-09-30 NOTE — Progress Notes (Signed)
Subjective:  Patient ID: Wanda Hammond, female    DOB: 1985-12-03  Age: 35 y.o. MRN: [id number]  CC: Anxiety (Pt here for 4 wk f/u also for depression. Pt c/o still feeling tired all the time and lack of energy.  Pt explains that she is still waking up in the middle of the night.)  HPI  Severe episode of recurrent major depressive disorder, without psychotic features (HCC) Stable mood but persistent fatigue, lack of motivation and fragmented sleep. She stopped daily exercise. No adverse effects with fluoxetine, has therapy appt scheduled for 10/05/2020. Bedtime at 9:30pm, <33mins to fall asleep, wakes up 2-3times during the night but able to fall asleep, wakes up at 5:30am, no electronic devices in bedroom, she reads to help fall asleep, coffee consumption 2cups in AM only, no ETOH, no soda, no tea, no tobacco use.  Advised to try melatonin OTC supplement. Resume daily exercise Maintain fuoxetine dose. F/up in 55month  Vitamin D deficiency Repeat vit. D  Wt Readings from Last 3 Encounters:  09/30/20 205 lb (93 kg)  08/31/20 201 lb 12.8 oz (91.5 kg)  07/30/20 203 lb 3.2 oz (92.2 kg)   Depression screen [facility] 2/9 08/31/2020 07/30/2020 07/18/2018  Decreased Interest 1 2 0  Down, Depressed, Hopeless 2 2 0  PHQ - 2 Score 3 4 0  Altered sleeping 2 3 -  Tired, decreased energy 3 3 -  Change in appetite 3 0 -  Feeling bad or failure about yourself  2 2 -  Trouble concentrating 1 0 -  Moving slowly or fidgety/restless 2 0 -  Suicidal thoughts 0 1 -  PHQ-9 Score 16 13 -  Difficult doing work/chores Very difficult Very difficult -   GAD 7 : Generalized Anxiety Score 08/31/2020 07/30/2020  Nervous, Anxious, on Edge 1 3  Control/stop worrying 3 3  Worry too much - different things 2 2  Trouble relaxing 2 1  Restless 2 1  Easily annoyed or irritable 3 3  Afraid - awful might happen 2 1  Total GAD 7 Score 15 14  Anxiety Difficulty Very difficult Very difficult   Reviewed past  Medical, Social and Family history today.  Outpatient Medications Prior to Visit  Medication Sig Dispense Refill  . albuterol (PROVENTIL HFA;VENTOLIN HFA) 108 (90 Base) MCG/ACT inhaler Inhale 1-2 puffs into the lungs every 6 (six) hours as needed. 1 Inhaler 0  . FLUoxetine (PROZAC) 20 MG tablet Take 2 tablets (40 mg total) by mouth daily. 60 tablet 5  . levonorgestrel (MIRENA) 20 MCG/24HR IUD Mirena 20 mcg/24 hours (5 yrs) 52 mg intrauterine device  Take 1 insert by intrauterine route.    . Vitamin D, Ergocalciferol, (DRISDOL) 1.25 MG (50000 UNIT) CAPS capsule Take 1 capsule (50,000 Units total) by mouth every 7 (seven) days. 12 capsule 0  . cetirizine (ZYRTEC) 10 MG tablet Take 1 tablet (10 mg total) by mouth daily. (Patient not taking: Reported on 09/30/2020) 30 tablet 11  . triamcinolone (KENALOG) 0.1 % Apply 1 application topically 2 (two) times daily. (Patient not taking: Reported on 09/30/2020) 30 g 0   No facility-administered medications prior to visit.    ROS See HPI  Objective:  BP 92/68 (BP Location: Left Arm, Patient Position: Sitting, Cuff Size: Normal)   Pulse 79   Temp 98.5 F (36.9 C) (Temporal)   Ht 5' 4.5" (1.638 m)   Wt 205 lb (93 kg)   SpO2 98%   BMI 34.64 kg/m   Physical Exam  Constitutional:      General: She is not in acute distress. Pulmonary:     Effort: Pulmonary effort is normal.  Neurological:     Mental Status: She is alert and oriented to person, place, and time.  Psychiatric:        Mood and Affect: Mood normal.        Behavior: Behavior normal.        Thought Content: Thought content normal.    Assessment & Plan:  This visit occurred during the SARS-CoV-2 public health emergency.  Safety protocols were in place, including screening questions prior to the visit, additional usage of staff PPE, and extensive cleaning of exam room while observing appropriate contact time as indicated for disinfecting solutions.   Merial was seen today for  anxiety.  Diagnoses and all orders for this visit:  Severe episode of recurrent major depressive disorder, without psychotic features (HCC)  Vitamin D deficiency -     Vitamin D 1,25 dihydroxy  Chronic fatigue -     Antinuclear Antib (ANA) -     B12   Problem List Items Addressed This Visit      Other   Severe episode of recurrent major depressive disorder, without psychotic features (HCC) - Primary    Stable mood but persistent fatigue, lack of motivation and fragmented sleep. She stopped daily exercise. No adverse effects with fluoxetine, has therapy appt scheduled for 10/05/2020. Bedtime at 9:30pm, <54mins to fall asleep, wakes up 2-3times during the night but able to fall asleep, wakes up at 5:30am, no electronic devices in bedroom, she reads to help fall asleep, coffee consumption 2cups in AM only, no ETOH, no soda, no tea, no tobacco use.  Advised to try melatonin OTC supplement. Resume daily exercise Maintain fuoxetine dose. F/up in 74month      Vitamin D deficiency    Repeat vit. D      Relevant Orders   Vitamin D 1,25 dihydroxy    Other Visit Diagnoses    Chronic fatigue       Relevant Orders   Antinuclear Antib (ANA)   B12      Follow-up: Return in about 4 weeks (around 10/28/2020) for fatigue and depression ( ).  Alysia Penna, NP

## 2020-09-30 NOTE — Assessment & Plan Note (Addendum)
Stable mood but persistent fatigue, lack of motivation and fragmented sleep. She stopped daily exercise. No adverse effects with fluoxetine, has therapy appt scheduled for 10/05/2020. Bedtime at 9:30pm, <28mins to fall asleep, wakes up 2-3times during the night but able to fall asleep, wakes up at 5:30am, no electronic devices in bedroom, she reads to help fall asleep, coffee consumption 2cups in AM only, no ETOH, no soda, no tea, no tobacco use.  Advised to try melatonin OTC supplement. Resume daily exercise Maintain fuoxetine dose. F/up in 23month

## 2020-09-30 NOTE — Patient Instructions (Addendum)
Maintain current medication dose Try melatonin+magnesium for sleep Resume daily exercise Maintain upcoming appt with therapist.  Go to lab for blood draw

## 2020-09-30 NOTE — Assessment & Plan Note (Signed)
Repeat vit. D °

## 2020-10-01 LAB — HM PAP SMEAR

## 2020-10-02 MED ORDER — VITAMIN B-12 1000 MCG PO TABS: 1000.0000 ug | ORAL_TABLET | Freq: Every day | ORAL | 1 refills | Status: DC

## 2020-10-03 LAB — VITAMIN D 1,25 DIHYDROXY
Vitamin D 1, 25 (OH)2 Total: 66 pg/mL (ref 18–72)
Vitamin D2 1, 25 (OH)2: 53 pg/mL
Vitamin D3 1, 25 (OH)2: 13 pg/mL

## 2020-10-03 LAB — ANA: Anti Nuclear Antibody (ANA): NEGATIVE

## 2020-10-06 ENCOUNTER — Ambulatory Visit (INDEPENDENT_AMBULATORY_CARE_PROVIDER_SITE_OTHER): Payer: 59 | Admitting: Psychology

## 2020-10-06 DIAGNOSIS — F331 Major depressive disorder, recurrent, moderate: Secondary | ICD-10-CM | POA: Diagnosis not present

## 2020-10-13 ENCOUNTER — Ambulatory Visit (INDEPENDENT_AMBULATORY_CARE_PROVIDER_SITE_OTHER): Payer: 59 | Admitting: Psychology

## 2020-10-13 DIAGNOSIS — F331 Major depressive disorder, recurrent, moderate: Secondary | ICD-10-CM | POA: Diagnosis not present

## 2020-10-21 ENCOUNTER — Ambulatory Visit (INDEPENDENT_AMBULATORY_CARE_PROVIDER_SITE_OTHER): Payer: 59 | Admitting: Psychology

## 2020-10-21 DIAGNOSIS — F331 Major depressive disorder, recurrent, moderate: Secondary | ICD-10-CM

## 2020-10-29 ENCOUNTER — Ambulatory Visit: Payer: 59 | Admitting: Psychology

## 2020-10-30 ENCOUNTER — Ambulatory Visit: Payer: 59 | Admitting: Nurse Practitioner

## 2020-11-03 ENCOUNTER — Ambulatory Visit: Payer: 59 | Admitting: Nurse Practitioner

## 2020-11-05 ENCOUNTER — Ambulatory Visit (INDEPENDENT_AMBULATORY_CARE_PROVIDER_SITE_OTHER): Payer: 59 | Admitting: Psychology

## 2020-11-05 DIAGNOSIS — F331 Major depressive disorder, recurrent, moderate: Secondary | ICD-10-CM

## 2020-11-10 ENCOUNTER — Ambulatory Visit (INDEPENDENT_AMBULATORY_CARE_PROVIDER_SITE_OTHER): Payer: 59 | Admitting: Psychology

## 2020-11-10 DIAGNOSIS — F331 Major depressive disorder, recurrent, moderate: Secondary | ICD-10-CM

## 2020-12-03 ENCOUNTER — Other Ambulatory Visit: Payer: Self-pay | Admitting: Nurse Practitioner

## 2020-12-03 DIAGNOSIS — F332 Major depressive disorder, recurrent severe without psychotic features: Secondary | ICD-10-CM

## 2020-12-07 ENCOUNTER — Ambulatory Visit (INDEPENDENT_AMBULATORY_CARE_PROVIDER_SITE_OTHER): Payer: 59 | Admitting: Psychology

## 2020-12-07 DIAGNOSIS — F331 Major depressive disorder, recurrent, moderate: Secondary | ICD-10-CM | POA: Diagnosis not present

## 2020-12-15 ENCOUNTER — Ambulatory Visit (INDEPENDENT_AMBULATORY_CARE_PROVIDER_SITE_OTHER): Payer: 59 | Admitting: Psychology

## 2020-12-15 DIAGNOSIS — F331 Major depressive disorder, recurrent, moderate: Secondary | ICD-10-CM

## 2020-12-22 ENCOUNTER — Ambulatory Visit (INDEPENDENT_AMBULATORY_CARE_PROVIDER_SITE_OTHER): Payer: 59 | Admitting: Psychology

## 2020-12-22 DIAGNOSIS — F331 Major depressive disorder, recurrent, moderate: Secondary | ICD-10-CM | POA: Diagnosis not present

## 2020-12-29 ENCOUNTER — Ambulatory Visit (INDEPENDENT_AMBULATORY_CARE_PROVIDER_SITE_OTHER): Payer: 59 | Admitting: Psychology

## 2020-12-29 DIAGNOSIS — F331 Major depressive disorder, recurrent, moderate: Secondary | ICD-10-CM

## 2021-01-04 ENCOUNTER — Ambulatory Visit (INDEPENDENT_AMBULATORY_CARE_PROVIDER_SITE_OTHER): Payer: 59 | Admitting: Psychology

## 2021-01-04 DIAGNOSIS — F331 Major depressive disorder, recurrent, moderate: Secondary | ICD-10-CM | POA: Diagnosis not present

## 2021-01-11 ENCOUNTER — Ambulatory Visit: Payer: 59 | Admitting: Psychology

## 2021-01-26 ENCOUNTER — Ambulatory Visit (INDEPENDENT_AMBULATORY_CARE_PROVIDER_SITE_OTHER): Payer: 59 | Admitting: Psychology

## 2021-01-26 DIAGNOSIS — F331 Major depressive disorder, recurrent, moderate: Secondary | ICD-10-CM

## 2021-01-27 ENCOUNTER — Ambulatory Visit (INDEPENDENT_AMBULATORY_CARE_PROVIDER_SITE_OTHER): Payer: 59 | Admitting: Psychology

## 2021-01-27 DIAGNOSIS — F331 Major depressive disorder, recurrent, moderate: Secondary | ICD-10-CM | POA: Diagnosis not present

## 2021-02-01 ENCOUNTER — Ambulatory Visit (INDEPENDENT_AMBULATORY_CARE_PROVIDER_SITE_OTHER): Payer: 59 | Admitting: Psychology

## 2021-02-01 DIAGNOSIS — F331 Major depressive disorder, recurrent, moderate: Secondary | ICD-10-CM | POA: Diagnosis not present

## 2021-02-08 ENCOUNTER — Ambulatory Visit: Payer: 59 | Admitting: Psychology

## 2021-02-09 ENCOUNTER — Ambulatory Visit (INDEPENDENT_AMBULATORY_CARE_PROVIDER_SITE_OTHER): Payer: 59 | Admitting: Psychology

## 2021-02-09 DIAGNOSIS — F331 Major depressive disorder, recurrent, moderate: Secondary | ICD-10-CM

## 2021-02-16 ENCOUNTER — Ambulatory Visit (INDEPENDENT_AMBULATORY_CARE_PROVIDER_SITE_OTHER): Payer: 59 | Admitting: Psychology

## 2021-02-16 DIAGNOSIS — F331 Major depressive disorder, recurrent, moderate: Secondary | ICD-10-CM

## 2021-02-18 ENCOUNTER — Ambulatory Visit: Payer: 59 | Admitting: Dermatology

## 2021-03-01 ENCOUNTER — Ambulatory Visit (INDEPENDENT_AMBULATORY_CARE_PROVIDER_SITE_OTHER): Payer: 59 | Admitting: Psychology

## 2021-03-01 DIAGNOSIS — F331 Major depressive disorder, recurrent, moderate: Secondary | ICD-10-CM

## 2021-03-02 ENCOUNTER — Ambulatory Visit: Payer: 59 | Admitting: Dermatology

## 2021-03-08 ENCOUNTER — Ambulatory Visit (INDEPENDENT_AMBULATORY_CARE_PROVIDER_SITE_OTHER): Payer: BC Managed Care – PPO | Admitting: Psychology

## 2021-03-08 DIAGNOSIS — F331 Major depressive disorder, recurrent, moderate: Secondary | ICD-10-CM

## 2021-03-10 ENCOUNTER — Other Ambulatory Visit: Payer: Self-pay | Admitting: Nurse Practitioner

## 2021-03-10 DIAGNOSIS — F332 Major depressive disorder, recurrent severe without psychotic features: Secondary | ICD-10-CM

## 2021-03-15 ENCOUNTER — Ambulatory Visit (INDEPENDENT_AMBULATORY_CARE_PROVIDER_SITE_OTHER): Payer: 59 | Admitting: Psychology

## 2021-03-15 DIAGNOSIS — F331 Major depressive disorder, recurrent, moderate: Secondary | ICD-10-CM

## 2021-03-22 ENCOUNTER — Ambulatory Visit: Payer: BC Managed Care – PPO | Admitting: Psychology

## 2021-03-29 ENCOUNTER — Ambulatory Visit: Payer: BC Managed Care – PPO | Admitting: Psychology

## 2021-04-05 ENCOUNTER — Ambulatory Visit (INDEPENDENT_AMBULATORY_CARE_PROVIDER_SITE_OTHER): Payer: BC Managed Care – PPO | Admitting: Psychology

## 2021-04-05 DIAGNOSIS — F331 Major depressive disorder, recurrent, moderate: Secondary | ICD-10-CM

## 2021-04-12 ENCOUNTER — Ambulatory Visit (INDEPENDENT_AMBULATORY_CARE_PROVIDER_SITE_OTHER): Payer: BC Managed Care – PPO | Admitting: Psychology

## 2021-04-12 DIAGNOSIS — F331 Major depressive disorder, recurrent, moderate: Secondary | ICD-10-CM | POA: Diagnosis not present

## 2021-04-19 ENCOUNTER — Ambulatory Visit (INDEPENDENT_AMBULATORY_CARE_PROVIDER_SITE_OTHER): Payer: BC Managed Care – PPO | Admitting: Psychology

## 2021-04-19 DIAGNOSIS — F331 Major depressive disorder, recurrent, moderate: Secondary | ICD-10-CM

## 2021-04-26 ENCOUNTER — Ambulatory Visit (INDEPENDENT_AMBULATORY_CARE_PROVIDER_SITE_OTHER): Payer: BC Managed Care – PPO | Admitting: Psychology

## 2021-04-26 DIAGNOSIS — F331 Major depressive disorder, recurrent, moderate: Secondary | ICD-10-CM | POA: Diagnosis not present

## 2021-04-29 ENCOUNTER — Other Ambulatory Visit (HOSPITAL_COMMUNITY)
Admission: RE | Admit: 2021-04-29 | Discharge: 2021-04-29 | Disposition: A | Discharge status: Home / Self Care | Payer: BC Managed Care – PPO | Source: Ambulatory Visit | Attending: Nurse Practitioner | Admitting: Nurse Practitioner

## 2021-04-29 ENCOUNTER — Other Ambulatory Visit: Payer: Self-pay

## 2021-04-29 ENCOUNTER — Ambulatory Visit (INDEPENDENT_AMBULATORY_CARE_PROVIDER_SITE_OTHER): Payer: BC Managed Care – PPO | Admitting: Nurse Practitioner

## 2021-04-29 ENCOUNTER — Encounter: Payer: Self-pay | Admitting: Nurse Practitioner

## 2021-04-29 VITALS — BP 110/60 | HR 92 | Temp 97.7°F | Ht 64.5 in | Wt 214.0 lb

## 2021-04-29 DIAGNOSIS — E538 Deficiency of other specified B group vitamins: Secondary | ICD-10-CM | POA: Diagnosis not present

## 2021-04-29 DIAGNOSIS — E559 Vitamin D deficiency, unspecified: Secondary | ICD-10-CM | POA: Diagnosis not present

## 2021-04-29 DIAGNOSIS — R8290 Unspecified abnormal findings in urine: Secondary | ICD-10-CM

## 2021-04-29 DIAGNOSIS — N898 Other specified noninflammatory disorders of vagina: Secondary | ICD-10-CM

## 2021-04-29 DIAGNOSIS — F3341 Major depressive disorder, recurrent, in partial remission: Secondary | ICD-10-CM

## 2021-04-29 LAB — POCT URINALYSIS DIPSTICK
Bilirubin, UA: NEGATIVE
Glucose, UA: NEGATIVE
Ketones, UA: NEGATIVE
Nitrite, UA: POSITIVE
Protein, UA: NEGATIVE
Spec Grav, UA: 1.015 (ref 1.010–1.025)
Urobilinogen, UA: 0.2 E.U./dL
pH, UA: 7.5 (ref 5.0–8.0)

## 2021-04-29 LAB — VITAMIN D 25 HYDROXY (VIT D DEFICIENCY, FRACTURES): VITD: 11.37 ng/mL — ABNORMAL LOW (ref 30.00–100.00)

## 2021-04-29 LAB — VITAMIN B12: Vitamin B-12: 232 pg/mL (ref 211–911)

## 2021-04-29 MED ORDER — FLUOXETINE HCL 20 MG PO TABS: 20.0000 mg | ORAL_TABLET | Freq: Every day | ORAL | 3 refills | Status: DC

## 2021-04-29 MED ORDER — METRONIDAZOLE 0.75 % VA GEL: 1.0000 | Freq: Every day | VAGINAL | 0 refills | Status: DC

## 2021-04-29 NOTE — Assessment & Plan Note (Signed)
She discontinued oral supplement Repeat Vit B12 today

## 2021-04-29 NOTE — Assessment & Plan Note (Signed)
She discontinued OTC supplement Repeat vit D today

## 2021-04-29 NOTE — Assessment & Plan Note (Signed)
Improved mood with fluoxetine and melatonin No adverse side effects. Med refill sent

## 2021-04-29 NOTE — Patient Instructions (Addendum)
Resume vitamin B12 tabs while waiting for lab results Start metrogel insertion while waiting for lab results. Normal urinalysis   Bacterial Vaginosis Bacterial vaginosis is an infection of the vagina. It happens when too many normal germs (healthy bacteria) grow in the vagina. This infection can make it easier to get other infections from sex (STIs). It is very important for pregnant women to get treated. This infection can cause babies to be born early or at a low birth weight. What are the causes? This infection is caused by an increase in certain germs that grow in the vagina. You cannot get this infection from toilet seats, bedsheets, swimming pools, or things that touch your vagina. What increases the risk? Having sex with a new person or more than one person. Having sex without protection. Douching. Having an intrauterine device (IUD). Smoking. Using drugs or drinking alcohol. These can lead you to do things that are risky. Taking certain antibiotic medicines. Being pregnant. What are the signs or symptoms? Some women have no symptoms. Symptoms may include: A discharge from your vagina. It may be gray or white. It can be watery or foamy. A fishy smell. This can happen after sex or during your menstrual period. Itching in and around your vagina. A feeling of burning or pain when you pee (urinate). How is this treated? This infection is treated with antibiotic medicines. These may be given to you as: A pill. A cream for your vagina. A medicine that you put into your vagina (suppository). If the infection comes back after treatment, you may need more antibiotics. Follow these instructions at home: Medicines Take over-the-counter and prescription medicines as told by your doctor. Take or use your antibiotic medicine as told by your doctor. Do not stop taking or using it, even if you start to feel better. General instructions If the person you have sex with is a woman, tell her  that you have this infection. She will need to follow up with her doctor. If you have a female partner, he does not need to be treated. Do not have sex until you finish treatment. Drink enough fluid to keep your pee pale yellow. Keep your vagina and butt clean. Wash the area with warm water each day. Wipe from front to back after you use the toilet. If you are breastfeeding a baby, ask your doctor if you should keep doing so during treatment. Keep all follow-up visits. How is this prevented? Self-care Do not douche. Use only warm water to wash around your vagina. Wear underwear that is cotton or lined with cotton. Do not wear tight pants and pantyhose, especially in the summer. Safe sex Use protection when you have sex. This includes: Use condoms. Use dental dams. This is a thin layer that protects the mouth during oral sex. Limit how many people you have sex with. To prevent this infection, it is best to have sex with just one person. Get tested for STIs. The person you have sex with should also get tested. Drugs and alcohol Do not smoke or use any products that contain nicotine or tobacco. If you need help quitting, ask your doctor. Do not use drugs. Do not drink alcohol if: Your doctor tells you not to drink. You are pregnant, may be pregnant, or are planning to become pregnant. If you drink alcohol: Limit how much you have to 0-1 drink a day. Know how much alcohol is in your drink. In the [place] one drink equals one 12 oz bottle of  beer (355 mL), one 5 oz glass of wine (148 mL), or one 1 oz glass of hard liquor (44 mL). Where to find more information Centers for Disease Control and Prevention: FootballExhibition.com.br American Sexual Health Association: www.ashastd.org Office on [employer] Health: http://hoffman.com/ Contact a doctor if: Your symptoms do not get better, even after you are treated. You have more discharge or pain when you pee. You have a fever or chills. You have pain in your  belly (abdomen) or in the area between your hips. You have pain with sex. You bleed from your vagina between menstrual periods. Summary This infection can happen when too many germs (bacteria) grow in the vagina. This infection can make it easier to get infections from sex (STIs). Treating this can lower that chance. Get treated if you are pregnant. This infection can cause babies to be born early. Do not stop taking or using your antibiotic medicine, even if you start to feel better. This information is not intended to replace advice given to you by your health care provider. Make sure you discuss any questions you have with your health care provider. Document Revised: 11/28/2019 Document Reviewed: 11/28/2019 Elsevier Patient Education  2022 [employer].

## 2021-04-29 NOTE — Progress Notes (Signed)
Subjective:  Patient ID: Wanda Hammond, female    DOB: 1985-06-20  Age: 35 y.o. MRN: [id number]  CC: Acute Visit (Pt c/o bad vaginal odor, cloudy urine, and possible discharge x 1 week. Pt denies burning or itching. Odor is stronger after urinating. )  Vaginal Discharge The patient's primary symptoms include a genital odor and vaginal discharge. The patient's pertinent negatives include no genital itching, genital lesions, genital rash, missed menses, pelvic pain or vaginal bleeding. This is a new problem. The current episode started in the past 7 days. The problem occurs constantly. The problem has been unchanged. The patient is experiencing no pain. The problem affects both sides. She is not pregnant. Pertinent negatives include no abdominal pain, anorexia, back pain, chills, constipation, diarrhea, discolored urine, dysuria, fever, flank pain, frequency, headaches, hematuria, joint pain, joint swelling, nausea, painful intercourse, rash, sore throat, urgency or vomiting. Associated symptoms comments: Cloudy urine. The vaginal discharge was thin and yellow. The vaginal bleeding is typical of menses. She has not been passing clots. She has not been passing tissue. Nothing aggravates the symptoms. She has tried nothing for the symptoms. She is not sexually active. She uses an IUD for contraception. Her menstrual history has been irregular. Her past medical history is significant for vaginosis. There is no history of an abdominal surgery, a Cesarean section, an ectopic pregnancy, endometriosis, a gynecological surgery, herpes simplex, menorrhagia, metrorrhagia, miscarriage, ovarian cysts, perineal abscess, PID, an STD or a terminated pregnancy.   Depressive disorder Improved mood with fluoxetine and melatonin No adverse side effects. Med refill sent  Vitamin D deficiency She discontinued OTC supplement Repeat vit D today  B12 deficiency She discontinued oral supplement Repeat Vit B12  today  Reviewed past Medical, Social and Family history today.  Outpatient Medications Prior to Visit  Medication Sig Dispense Refill   albuterol (PROVENTIL HFA;VENTOLIN HFA) 108 (90 Base) MCG/ACT inhaler Inhale 1-2 puffs into the lungs every 6 (six) hours as needed. 1 Inhaler 0   levonorgestrel (MIRENA) 20 MCG/24HR IUD Mirena 20 mcg/24 hours (5 yrs) 52 mg intrauterine device  Take 1 insert by intrauterine route.     vitamin B-12 (CYANOCOBALAMIN) 1000 MCG tablet Take 1 tablet (1,000 mcg total) by mouth daily. 90 tablet 1   Vitamin D, Ergocalciferol, (DRISDOL) 1.25 MG (50000 UNIT) CAPS capsule Take 1 capsule (50,000 Units total) by mouth every 7 (seven) days. 12 capsule 0   FLUoxetine (PROZAC) 20 MG tablet TAKE 1 TABLET BY MOUTH EVERY DAY 90 tablet 0   No facility-administered medications prior to visit.    ROS See HPI  Objective:  BP 110/60 (BP Location: Left Arm, Patient Position: Sitting, Cuff Size: Large)   Pulse 92   Temp 97.7 F (36.5 C) (Temporal)   Ht 5' 4.5" (1.638 m)   Wt 214 lb (97.1 kg)   SpO2 96%   BMI 36.17 kg/m   Physical Exam Vitals reviewed. Exam conducted with a chaperone present.  Abdominal:     Hernia: There is no hernia in the left inguinal area or right inguinal area.  Genitourinary:    General: Normal vulva.     Labia:        Right: No rash or tenderness.        Left: No rash or tenderness.      Vagina: Vaginal discharge present. No erythema, tenderness, bleeding or lesions.     Cervix: Discharge present. No cervical motion tenderness or erythema.     Uterus: Not tender.  Adnexa: Right adnexa normal and left adnexa normal.     Comments: IUD string in cervical os Lymphadenopathy:     Lower Body: No right inguinal adenopathy. No left inguinal adenopathy.  Neurological:     Mental Status: She is alert.   Assessment & Plan:  This visit occurred during the SARS-CoV-2 public health emergency.  Safety protocols were in place, including screening  questions prior to the visit, additional usage of staff PPE, and extensive cleaning of exam room while observing appropriate contact time as indicated for disinfecting solutions.   Wanda Hammond was seen today for acute visit.  Diagnoses and all orders for this visit:  Vaginal discharge -     Cervicovaginal ancillary only( [place]) -     metroNIDAZOLE (METROGEL VAGINAL) 0.75 % vaginal gel; Place 1 Applicatorful vaginally at bedtime. x5nights  Cloudy urine -     POCT urinalysis dipstick  Vitamin D deficiency -     Vitamin D (25 hydroxy)  B12 deficiency -     B12  Recurrent major depressive disorder, in partial remission (HCC) -     FLUoxetine (PROZAC) 20 MG tablet; Take 1 tablet (20 mg total) by mouth daily. Resume vitamin B12 tabs while waiting for lab results Start metrogel insertion while waiting for lab results. Normal urinalysis  Problem List Items Addressed This Visit       Other   B12 deficiency    She discontinued oral supplement Repeat Vit B12 today      Relevant Orders   B12   Depressive disorder    Improved mood with fluoxetine and melatonin No adverse side effects. Med refill sent      Relevant Medications   FLUoxetine (PROZAC) 20 MG tablet   Vitamin D deficiency    She discontinued OTC supplement Repeat vit D today      Relevant Orders   Vitamin D (25 hydroxy)   Other Visit Diagnoses     Vaginal discharge    -  Primary   Relevant Medications   metroNIDAZOLE (METROGEL VAGINAL) 0.75 % vaginal gel   Other Relevant Orders   Cervicovaginal ancillary only( [place])   Cloudy urine       Relevant Orders   POCT urinalysis dipstick (Completed)       Follow-up: No follow-ups on file.  Alysia Penna, NP

## 2021-04-30 LAB — CERVICOVAGINAL ANCILLARY ONLY
Bacterial Vaginitis (gardnerella): POSITIVE — AB
Candida Glabrata: NEGATIVE
Candida Vaginitis: NEGATIVE
Comment: NEGATIVE
Comment: NEGATIVE
Comment: NEGATIVE

## 2021-04-30 MED ORDER — VITAMIN D (ERGOCALCIFEROL) 1.25 MG (50000 UNIT) PO CAPS: 50000.0000 [IU] | ORAL_CAPSULE | ORAL | 0 refills | Status: DC

## 2021-04-30 NOTE — Addendum Note (Signed)
Addended by: Alysia Penna L on: 04/30/2021 11:50 AM   Modules accepted: Orders

## 2021-04-30 NOTE — Progress Notes (Signed)
Normal B12, continue daily OTC Low vitamin D: resume 50000IU weekly x8weeks, then switch to 5000IU daily OTC. Positive BV only: continue metrogel as prescribed

## 2021-05-03 ENCOUNTER — Ambulatory Visit (INDEPENDENT_AMBULATORY_CARE_PROVIDER_SITE_OTHER): Payer: BC Managed Care – PPO | Admitting: Psychology

## 2021-05-03 DIAGNOSIS — F331 Major depressive disorder, recurrent, moderate: Secondary | ICD-10-CM

## 2021-05-04 ENCOUNTER — Other Ambulatory Visit: Payer: Self-pay | Admitting: Nurse Practitioner

## 2021-05-04 DIAGNOSIS — F3341 Major depressive disorder, recurrent, in partial remission: Secondary | ICD-10-CM

## 2021-05-10 ENCOUNTER — Ambulatory Visit (INDEPENDENT_AMBULATORY_CARE_PROVIDER_SITE_OTHER): Payer: BC Managed Care – PPO | Admitting: Psychology

## 2021-05-10 DIAGNOSIS — F331 Major depressive disorder, recurrent, moderate: Secondary | ICD-10-CM

## 2021-05-11 NOTE — Telephone Encounter (Signed)
Duplicate, filled 04/29/21

## 2021-05-18 ENCOUNTER — Ambulatory Visit (INDEPENDENT_AMBULATORY_CARE_PROVIDER_SITE_OTHER): Payer: BC Managed Care – PPO | Admitting: Psychology

## 2021-05-18 DIAGNOSIS — F331 Major depressive disorder, recurrent, moderate: Secondary | ICD-10-CM

## 2021-05-18 NOTE — Progress Notes (Signed)
[facility] Counselor/Therapist Progress Note  Patient ID: Wanda Hammond, MRN: [id number],    Date: 05/18/2021  Time Spent: 8:02am-8:56am   Treatment Type: Individual Therapy Pt is seen for virtual visit via webex.  Pt joins from her car and counselor from her home office.   Reported Symptoms: Pt reported feeling mentally tired recently.  Pt reported on interaction w/ dad and still not knowing if he will follow through and no longer financially supporting.  Pt discussed awareness of change in emotional support from husband as separating.  Pt discussed communication and setting boundaries w/ dad and coparenting w/ husband.    Mental Status Exam: Appearance:  Well Groomed     Behavior: Appropriate  Motor: Normal  Speech/Language:  Normal Rate  Affect: Appropriate  Mood: normal  Thought process: normal  Thought content:   WNL  Sensory/Perceptual disturbances:   WNL  Orientation: oriented to person, place, time/date, and situation  Attention: Good  Concentration: Good  Memory: WNL  Fund of knowledge:  Good  Insight:   Good  Judgment:  Good  Impulse Control: Good   Risk Assessment: Danger to Self:  No Self-injurious Behavior: No Danger to Others: No Duty to Warn:no Physical Aggression / Violence:No  Access to Firearms a concern: No  Gang Involvement:No   Subjective: Counselor assessed pt current functioning per pt report.  Processed w/pt interactions w/ dad and emotions related.  Explored pattern of relationships and how seeking support.  Assisted in supporting healthy boundaries and effective communication.   Interventions: Cognitive Behavioral Therapy, Assertiveness/Communication, Insight-Oriented, and Family Systems  Diagnosis:Major depressive disorder, recurrent episode, moderate (HCC)  Plan: Pt to f/u next week for counseling to continue building coping skills for reducing depression.  Pt to f/u as scheduled w/ PCP for medication management.    Forde Radon, [facility]

## 2021-05-24 ENCOUNTER — Ambulatory Visit (INDEPENDENT_AMBULATORY_CARE_PROVIDER_SITE_OTHER): Payer: BC Managed Care – PPO | Admitting: Psychology

## 2021-05-24 DIAGNOSIS — F331 Major depressive disorder, recurrent, moderate: Secondary | ICD-10-CM | POA: Diagnosis not present

## 2021-05-24 NOTE — Progress Notes (Addendum)
[facility] Counselor/Therapist Progress Note  Patient ID: Wanda Hammond, MRN: [id number],    Date: 05/24/2021  Time Spent: 8:00am-8:47am   Treatment Type: Individual Therapy Pt is seen for virtual video visit via webex.  Pt joins from her car and counselor from her home office.   Reported Symptoms: Pt reported fatigue, pt reports anxiety, frustration w/ upcoming transitions.   Mental Status Exam: Appearance:  Well Groomed     Behavior: Appropriate  Motor: Normal  Speech/Language:  Normal Rate  Affect: Appropriate  Mood: normal  Thought process: normal  Thought content:   WNL  Sensory/Perceptual disturbances:   WNL  Orientation: oriented to person, place, time/date, and situation  Attention: Good  Concentration: Good  Memory: WNL  Fund of knowledge:  Good  Insight:   Good  Judgment:  Good  Impulse Control: Good   Risk Assessment: Danger to Self:  No Self-injurious Behavior: No Danger to Others: No Duty to Warn:no Physical Aggression / Violence:No  Access to Firearms a concern: No  Gang Involvement:No   Subjective: Counselor assessed pt current functioning per pt report.  Processed w/pt separation and communication.  Reflected how impacting and keeping boundaries w/ things out of her control.   Discussed effective communication.   Pt reported that she has been busy w/ kid activities over the weekend and some anxiety re: preparing for holidays and transition w/ husband moving out.  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  Pt discussed communicating her needs w/ making transition in the home.  Pt discussed coping w/ uncertainties and things she doesn't have control over.    Interventions: Cognitive Behavioral Therapy, Assertiveness/Communication, Insight-Oriented, and ACT  Diagnosis:Major depressive disorder, recurrent episode, moderate  (HCC)  Plan: Pt to f/u next week for counseling to continue building coping skills for reducing depression.  See tx plan on file in therapy charts.  Pt to f/u as scheduled w/ PCP for medication management.   Forde Radon, [facility]

## 2021-05-27 ENCOUNTER — Encounter: Payer: Self-pay | Admitting: Nurse Practitioner

## 2021-06-01 ENCOUNTER — Ambulatory Visit (INDEPENDENT_AMBULATORY_CARE_PROVIDER_SITE_OTHER): Payer: BC Managed Care – PPO | Admitting: Psychology

## 2021-06-01 DIAGNOSIS — F331 Major depressive disorder, recurrent, moderate: Secondary | ICD-10-CM

## 2021-06-01 NOTE — Progress Notes (Signed)
[facility] Counselor/Therapist Progress Note  Patient ID: Wanda Hammond, MRN: [id number],    Date: 06/01/2021  Time Spent: 8:00am-8:50am   Treatment Type: Individual Therapy Pt is seen for virtual video visit via webex.  Pt joins from her car and counselor from her home office.   Reported Symptoms: Pt reported stress w/ upcoming transitions.   Mental Status Exam: Appearance:  Well Groomed     Behavior: Appropriate  Motor: Normal  Speech/Language:  Normal Rate  Affect: Appropriate  Mood: normal  Thought process: normal  Thought content:   WNL  Sensory/Perceptual disturbances:   WNL  Orientation: oriented to person, place, time/date, and situation  Attention: Good  Concentration: Good  Memory: WNL  Fund of knowledge:  Good  Insight:   Good  Judgment:  Good  Impulse Control: Good   Risk Assessment: Danger to Self:  No Self-injurious Behavior: No Danger to Others: No Duty to Warn:no Physical Aggression / Violence:No  Access to Firearms a concern: No  Gang Involvement:No   Subjective: Counselor assessed pt current functioning per pt report.  Processed w/pt separation and communication.  Discussed effective boundary setting and communication.  Pt affect wnl.  Pt reported on communication w/ husband and upcoming separation. Pt discussed how communicated about boundaries and that outcome positives and good to have communicated these.     Interventions: Cognitive Behavioral Therapy, Assertiveness/Communication, Insight-Oriented, and ACT  Diagnosis:Major depressive disorder, recurrent episode, moderate (HCC)  Plan: Pt to f/u next week for counseling to continue building coping skills for reducing depression.  See tx plan on file in therapy charts- target dates 10/14/21.  Pt to f/u as scheduled w/ PCP for medication management.   Forde Radon, [facility]

## 2021-06-08 ENCOUNTER — Ambulatory Visit (INDEPENDENT_AMBULATORY_CARE_PROVIDER_SITE_OTHER): Payer: BC Managed Care – PPO | Admitting: Psychology

## 2021-06-08 DIAGNOSIS — F331 Major depressive disorder, recurrent, moderate: Secondary | ICD-10-CM

## 2021-06-08 NOTE — Progress Notes (Signed)
[facility] Counselor/Therapist Progress Note  Patient ID: Wanda Hammond, MRN: [id number],    Date: 06/08/2021  Time Spent: 8:00am-8:43am   Treatment Type: Individual Therapy Pt is seen for virtual video visit via webex.  Pt joins from her car and counselor from her home office.   Reported Symptoms: Pt reported stress w/ upcoming transitions.   Mental Status Exam: Appearance:  Well Groomed     Behavior: Appropriate  Motor: Normal  Speech/Language:  Normal Rate  Affect: Appropriate  Mood: normal  Thought process: normal  Thought content:   WNL  Sensory/Perceptual disturbances:   WNL  Orientation: oriented to person, place, time/date, and situation  Attention: Good  Concentration: Good  Memory: WNL  Fund of knowledge:  Good  Insight:   Good  Judgment:  Good  Impulse Control: Good   Risk Assessment: Danger to Self:  No Self-injurious Behavior: No Danger to Others: No Duty to Warn:no Physical Aggression / Violence:No  Access to Firearms a concern: No  Gang Involvement:No   Subjective: Counselor assessed pt current functioning per pt report.  Processed w/pt interactions and emotions w/ separation and upcoming transition.  Discussed coparenting and recognizing need for continued communication and flexibility as things transition this year. Discussed self care and planning for self in coming month. Pt affect wnl.  Pt reported on interactions over the holidays.  Pt discussed communication w/ husband and acknowledging how things will continue to shift and modify as go through this first year separated.  Pt good awareness of need to focus on self care and being mindful of her routines she establishes for self.   Interventions: Cognitive Behavioral Therapy, Assertiveness/Communication, Insight-Oriented, and ACT  Diagnosis:Major depressive disorder, recurrent episode, moderate (HCC)  Plan: Pt to f/u next week for counseling to continue building coping skills  for reducing depression.  See tx plan on file in therapy charts- target dates 10/14/21.  Pt to f/u as scheduled w/ PCP for medication management.   Forde Radon, [facility]

## 2021-06-15 ENCOUNTER — Ambulatory Visit: Payer: BC Managed Care – PPO | Admitting: Psychology

## 2021-06-15 ENCOUNTER — Ambulatory Visit (INDEPENDENT_AMBULATORY_CARE_PROVIDER_SITE_OTHER): Payer: BC Managed Care – PPO | Admitting: Psychology

## 2021-06-15 DIAGNOSIS — F331 Major depressive disorder, recurrent, moderate: Secondary | ICD-10-CM

## 2021-06-15 NOTE — Progress Notes (Signed)
[facility] Counselor/Therapist Progress Note  Patient ID: Wanda Hammond, MRN: [id number],    Date: 06/15/2021  Time Spent: 8:00am-8:40am   Treatment Type: Individual Therapy Pt is seen for virtual video visit via webex.  Pt joins from her car and counselor from her home office.   Reported Symptoms: Pt reported mood ok, pt reported stress of transition. Mental Status Exam: Appearance:  Well Groomed     Behavior: Appropriate  Motor: Normal  Speech/Language:  Normal Rate  Affect: Appropriate  Mood: normal  Thought process: normal  Thought content:   WNL  Sensory/Perceptual disturbances:   WNL  Orientation: oriented to person, place, time/date, and situation  Attention: Good  Concentration: Good  Memory: WNL  Fund of knowledge:  Good  Insight:   Good  Judgment:  Good  Impulse Control: Good   Risk Assessment: Danger to Self:  No Self-injurious Behavior: No Danger to Others: No Duty to Warn:no Physical Aggression / Violence:No  Access to Firearms a concern: No  Gang Involvement:No   Subjective: Counselor assessed pt current functioning per pt report.  Processed w/pt transition of separation.  Discussed routines and changes.  Explored w/pt time for processing through emotions. Pt affect wnl and reported mood ok.  Pt reported husband moved out over weekend and interactions surrounding.  Pt recognized hadn't had time to process through emotions but does feel overall relief.  Pt discussed how routines consistent in a lot of ways and some changes that will be adapting to. Pt discussed how first time to self will be in 2 weekends.    Interventions: Cognitive Behavioral Therapy, Assertiveness/Communication, Insight-Oriented, and ACT  Diagnosis:Major depressive disorder, recurrent episode, moderate (HCC)  Plan: Pt to f/u next week for counseling to continue building coping skills for reducing depression.   Pt to f/u as scheduled w/ PCP for medication management.    Treatment Plan 10/14/20 Pt participated in development of plan and provided verbal consent Client Abilities/Strengths  supports: Best friend, mom at times, husband trying to help out more positives: returning to school to complete degree seeking tx for self  Client Treatment Preferences  pt to f/u w/ counseling every 1-2 weeks pt to continue w/ PCP for medication management.  Client Statement of Needs  "internal joy. I don't want to dread the day. " have some motivation- not push things to the next day".  Treatment Level  outpatient counseling  Symptoms  Depressed or irritable mood.: No Description Entered (Status: maintained). Diminished interest in or enjoyment of activities.: No Description Entered (Status: maintained). Feelings of hopelessness, worthlessness, or inappropriate guilt.: No Description Entered (Status: maintained). Lack of energy.: No Description Entered (Status: maintained). Low self-esteem.: No Description Entered (Status: maintained). Poor concentration and indecisiveness.: No Description Entered (Status: maintained). Sleeplessness or hypersomnia.: No Description Entered (Status: maintained).  Problems Addressed  Unipolar Depression  Goals 1. Alleviate depressive symptoms and return to previous level of effective functioning. Objective Implement mindfulness techniques for relapse prevention. Target Date: 2021-10-14 Frequency: Daily Progress: 0 Modality: individual Related Interventions 1. Use mindfulness meditation and cognitive therapy techniques to help the client learn to recognize and regulate the negative thought processes associated with depression and to change his/her relationship with these thoughts (see Mindfulness-Based Cognitive Therapy for Depression by Chevis Pretty, and Shona Simpson). Objective Increasingly verbalize hopeful and positive statements regarding self, others, and the future. Target Date: 2021-10-14 Frequency: Daily Progress: 0 Modality:  individual Related Interventions 2. Assign the client to write at least one positive affirmation statement daily  regarding himself/herself and the future (or assign "Positive Self-Talk" in the Adult Psychotherapy Homework Planner by Stephannie Li). Objective Learn and implement conflict resolution skills to resolve interpersonal problems. Target Date: 2021-10-14 Frequency: Daily Progress: 0 Modality: individual Related Interventions 3. Teach conflict resolution skills (e.g., empathy, active listening, "I messages," respectful communication, assertiveness without aggression, compromise); use psychoeducation, modeling, role-playing, and rehearsal to work through several current conflicts; assign homework exercises; review and repeat so as to integrate their use into the client's life. Objective Learn and implement behavioral strategies to overcome depression. Target Date: 2021-10-14 Frequency: Daily Progress: 0 Modality: individual Related Interventions 4. Engage the client in "behavioral activation," increasing his/her activity level and contact with sources of reward, while identifying processes that inhibit activation (see Behavioral Activation for Depression by Katharine Look, Dimidjian, and Herman-Dunn; or assign "Identify and Schedule Pleasant Activities" in the Adult Psychotherapy Homework Planner by [facility]); use behavioral techniques such as instruction, rehearsal, role-playing, role reversal, as needed, to facilitate activity in the client's daily life; reinforce success. Objective Identify and replace thoughts and beliefs that support depression. Target Date: 2021-10-14 Frequency: Daily Progress: 0 Modality: individual Related Interventions 5. Conduct Cognitive-Behavioral Therapy (see Cognitive Behavior Therapy by Reola Calkins; Overcoming Depression by Agapito Games al.), beginning with helping the client learn the connection among cognition, depressive feelings, and actions.   Forde Radon, [facility]

## 2021-06-21 ENCOUNTER — Ambulatory Visit (INDEPENDENT_AMBULATORY_CARE_PROVIDER_SITE_OTHER): Payer: BC Managed Care – PPO | Admitting: Psychology

## 2021-06-21 DIAGNOSIS — F331 Major depressive disorder, recurrent, moderate: Secondary | ICD-10-CM

## 2021-06-21 NOTE — Progress Notes (Signed)
[facility] Counselor/Therapist Progress Note  Patient ID: Wanda Hammond, MRN: [id number],    Date: 06/21/2021  Time Spent: 8:00am-8:40am   Treatment Type: Individual Therapy Pt is seen for virtual video visit via webex.  Pt joins from her car and counselor from her home office.   Reported Symptoms: Pt reported mood good, pt report conflict w/ dad. Mental Status Exam: Appearance:  Well Groomed     Behavior: Appropriate  Motor: Normal  Speech/Language:  Normal Rate  Affect: Appropriate  Mood: normal  Thought process: normal  Thought content:   WNL  Sensory/Perceptual disturbances:   WNL  Orientation: oriented to person, place, time/date, and situation  Attention: Good  Concentration: Good  Memory: WNL  Fund of knowledge:  Good  Insight:   Good  Judgment:  Good  Impulse Control: Good   Risk Assessment: Danger to Self:  No Self-injurious Behavior: No Danger to Others: No Duty to Warn:no Physical Aggression / Violence:No  Access to Firearms a concern: No  Gang Involvement:No   Subjective: Counselor assessed pt current functioning per pt report.  Processed w/pt transition of separation and conflict w/ dad. Explored conflict resolution and communication.  Discussed boundaries establishing w/ dad and options w/ approach moving forward. Pt affect wnl and reported mood has been good.  Pt reported that getting routine down w/ changes and had busy weekend w/ son's basketball.  Pt reported conflict w/ dad over phone when berated for separating, stated had to pay car payment but didn't want to sell car to her or put in her name.  Pt discussed how she set boundary of not allowing dad to control things and prepared for buying her own car.  Pt reported felt good to stand up to dad and doesn't feel anxious about what's next- although more financial hardship.  Pt discussed coparenting and adjusting to that and communication.  .   Interventions: Cognitive Behavioral  Therapy, Assertiveness/Communication, Insight-Oriented, and ACT  Diagnosis:Major depressive disorder, recurrent episode, moderate (HCC)  Plan: Pt to f/u next week for counseling to continue building coping skills for reducing depression.   Pt to f/u as scheduled w/ PCP for medication management.   Treatment Plan 10/14/20 Pt participated in development of plan and provided verbal consent Client Abilities/Strengths  supports: Best friend, mom at times, husband trying to help out more positives: returning to school to complete degree seeking tx for self  Client Treatment Preferences  pt to f/u w/ counseling every 1-2 weeks pt to continue w/ PCP for medication management.  Client Statement of Needs  "internal joy. I don't want to dread the day. " have some motivation- not push things to the next day".  Treatment Level  outpatient counseling  Symptoms  Depressed or irritable mood.: No Description Entered (Status: maintained). Diminished interest in or enjoyment of activities.: No Description Entered (Status: maintained). Feelings of hopelessness, worthlessness, or inappropriate guilt.: No Description Entered (Status: maintained). Lack of energy.: No Description Entered (Status: maintained). Low self-esteem.: No Description Entered (Status: maintained). Poor concentration and indecisiveness.: No Description Entered (Status: maintained). Sleeplessness or hypersomnia.: No Description Entered (Status: maintained).  Problems Addressed  Unipolar Depression  Goals 1. Alleviate depressive symptoms and return to previous level of effective functioning. Objective Implement mindfulness techniques for relapse prevention. Target Date: 2021-10-14 Frequency: Daily Progress: 0 Modality: individual Related Interventions 1. Use mindfulness meditation and cognitive therapy techniques to help the client learn to recognize and regulate the negative thought processes associated with depression and to change his/her  relationship with these thoughts (see Mindfulness-Based Cognitive Therapy for Depression by Chevis Pretty, and Shona Simpson). Objective Increasingly verbalize hopeful and positive statements regarding self, others, and the future. Target Date: 2021-10-14 Frequency: Daily Progress: 0 Modality: individual Related Interventions 2. Assign the client to write at least one positive affirmation statement daily regarding himself/herself and the future (or assign "Positive Self-Talk" in the Adult Psychotherapy Homework Planner by Stephannie Li). Objective Learn and implement conflict resolution skills to resolve interpersonal problems. Target Date: 2021-10-14 Frequency: Daily Progress: 0 Modality: individual Related Interventions 3. Teach conflict resolution skills (e.g., empathy, active listening, "I messages," respectful communication, assertiveness without aggression, compromise); use psychoeducation, modeling, role-playing, and rehearsal to work through several current conflicts; assign homework exercises; review and repeat so as to integrate their use into the client's life. Objective Learn and implement behavioral strategies to overcome depression. Target Date: 2021-10-14 Frequency: Daily Progress: 0 Modality: individual Related Interventions 4. Engage the client in "behavioral activation," increasing his/her activity level and contact with sources of reward, while identifying processes that inhibit activation (see Behavioral Activation for Depression by Katharine Look, Dimidjian, and Herman-Dunn; or assign "Identify and Schedule Pleasant Activities" in the Adult Psychotherapy Homework Planner by [facility]); use behavioral techniques such as instruction, rehearsal, role-playing, role reversal, as needed, to facilitate activity in the client's daily life; reinforce success. Objective Identify and replace thoughts and beliefs that support depression. Target Date: 2021-10-14 Frequency: Daily Progress: 0 Modality:  individual Related Interventions 5. Conduct Cognitive-Behavioral Therapy (see Cognitive Behavior Therapy by Reola Calkins; Overcoming Depression by Agapito Games al.), beginning with helping the client learn the connection among cognition, depressive feelings, and actions.   Forde Radon, [facility]

## 2021-06-29 ENCOUNTER — Ambulatory Visit: Payer: BC Managed Care – PPO | Admitting: Psychology

## 2021-07-05 ENCOUNTER — Ambulatory Visit (INDEPENDENT_AMBULATORY_CARE_PROVIDER_SITE_OTHER): Payer: BC Managed Care – PPO | Admitting: Psychology

## 2021-07-05 DIAGNOSIS — F331 Major depressive disorder, recurrent, moderate: Secondary | ICD-10-CM | POA: Diagnosis not present

## 2021-07-05 NOTE — Progress Notes (Signed)
[facility] Counselor/Therapist Progress Note  Patient ID: Iceis Knab, MRN: [id number],    Date: 07/05/2021  Time Spent: 8:00am-8:47am   Treatment Type: Individual Therapy Pt is seen for virtual video visit via webex.  Pt joins from her home and counselor from her home office.   Reported Symptoms: Pt reported mood good, pt report conflict w/ dad. Mental Status Exam: Appearance:  Well Groomed     Behavior: Appropriate  Motor: Normal  Speech/Language:  Normal Rate  Affect: Appropriate  Mood: normal  Thought process: normal  Thought content:   WNL  Sensory/Perceptual disturbances:   WNL  Orientation: oriented to person, place, time/date, and situation  Attention: Good  Concentration: Good  Memory: WNL  Fund of knowledge:  Good  Insight:   Good  Judgment:  Good  Impulse Control: Good   Risk Assessment: Danger to Self:  No Self-injurious Behavior: No Danger to Others: No Duty to Warn:no Physical Aggression / Violence:No  Access to Firearms a concern: No  Gang Involvement:No   Subjective: Counselor assessed pt current functioning per pt report.  Processed w/pt interactions w/ dad and husband.  Reflected pt assertiveness and outcomes of standing up for self.  Discussed upcoming decisions and validating feeling nervous and excited for self.  Pt affect wnl and reported mood has been good.  Pt reported that she has taken today off and made decision to not wait for dad to decide when he is going to take care and be in control in her own life.  Pt reported has felt good to be able to assert and stand up for self.  Pt discussed pattern of not being able to do this and how this has played out in past for her relationship w/ dad and husband.  Pt reported communication w/ her husband separated from and being assertive and not getting past patterns.  Pt reports that has been some anxiety provoking car shopping on her own, but learning a lot and feeling good that she  is making this decision for herself.      Interventions: Cognitive Behavioral Therapy, Assertiveness/Communication, Insight-Oriented, and ACT  Diagnosis:Major depressive disorder, recurrent episode, moderate (HCC)  Plan: Pt to f/u next week for counseling to continue building coping skills for reducing depression.   Pt to f/u as scheduled w/ PCP for medication management.   Treatment Plan 10/14/20 Pt participated in development of plan and provided verbal consent Client Abilities/Strengths  supports: Best friend, mom at times, husband trying to help out more positives: returning to school to complete degree seeking tx for self  Client Treatment Preferences  pt to f/u w/ counseling every 1-2 weeks pt to continue w/ PCP for medication management.  Client Statement of Needs  "internal joy. I don't want to dread the day. " have some motivation- not push things to the next day".  Treatment Level  outpatient counseling  Symptoms  Depressed or irritable mood.: No Description Entered (Status: maintained). Diminished interest in or enjoyment of activities.: No Description Entered (Status: maintained). Feelings of hopelessness, worthlessness, or inappropriate guilt.: No Description Entered (Status: maintained). Lack of energy.: No Description Entered (Status: maintained). Low self-esteem.: No Description Entered (Status: maintained). Poor concentration and indecisiveness.: No Description Entered (Status: maintained). Sleeplessness or hypersomnia.: No Description Entered (Status: maintained).  Problems Addressed  Unipolar Depression  Goals 1. Alleviate depressive symptoms and return to previous level of effective functioning. Objective Implement mindfulness techniques for relapse prevention. Target Date: 2021-10-14 Frequency: Daily Progress: 0 Modality: individual Related  Interventions 1. Use mindfulness meditation and cognitive therapy techniques to help the client learn to recognize and regulate  the negative thought processes associated with depression and to change his/her relationship with these thoughts (see Mindfulness-Based Cognitive Therapy for Depression by Chevis Pretty, and Shona Simpson). Objective Increasingly verbalize hopeful and positive statements regarding self, others, and the future. Target Date: 2021-10-14 Frequency: Daily Progress: 0 Modality: individual Related Interventions 2. Assign the client to write at least one positive affirmation statement daily regarding himself/herself and the future (or assign "Positive Self-Talk" in the Adult Psychotherapy Homework Planner by Stephannie Li). Objective Learn and implement conflict resolution skills to resolve interpersonal problems. Target Date: 2021-10-14 Frequency: Daily Progress: 0 Modality: individual Related Interventions 3. Teach conflict resolution skills (e.g., empathy, active listening, "I messages," respectful communication, assertiveness without aggression, compromise); use psychoeducation, modeling, role-playing, and rehearsal to work through several current conflicts; assign homework exercises; review and repeat so as to integrate their use into the client's life. Objective Learn and implement behavioral strategies to overcome depression. Target Date: 2021-10-14 Frequency: Daily Progress: 0 Modality: individual Related Interventions 4. Engage the client in "behavioral activation," increasing his/her activity level and contact with sources of reward, while identifying processes that inhibit activation (see Behavioral Activation for Depression by Katharine Look, Dimidjian, and Herman-Dunn; or assign "Identify and Schedule Pleasant Activities" in the Adult Psychotherapy Homework Planner by [facility]); use behavioral techniques such as instruction, rehearsal, role-playing, role reversal, as needed, to facilitate activity in the client's daily life; reinforce success. Objective Identify and replace thoughts and beliefs that support  depression. Target Date: 2021-10-14 Frequency: Daily Progress: 0 Modality: individual Related Interventions 5. Conduct Cognitive-Behavioral Therapy (see Cognitive Behavior Therapy by Reola Calkins; Overcoming Depression by Agapito Games al.), beginning with helping the client learn the connection among cognition, depressive feelings, and actions.   Forde Radon [facility]                  [place], [facility]

## 2021-07-12 ENCOUNTER — Ambulatory Visit (INDEPENDENT_AMBULATORY_CARE_PROVIDER_SITE_OTHER): Payer: BC Managed Care – PPO | Admitting: Psychology

## 2021-07-12 DIAGNOSIS — F331 Major depressive disorder, recurrent, moderate: Secondary | ICD-10-CM

## 2021-07-12 NOTE — Progress Notes (Signed)
[facility] Counselor/Therapist Progress Note  Patient ID: Wanda Hammond, MRN: [id number],    Date: 07/12/2021  Time Spent: 8:00am-8:45am   Treatment Type: Individual Therapy Pt is seen for virtual video visit via webex.  Pt joins from her car outside of work and counselor from her home office.   Reported Symptoms: Pt reported mood good, pt report feels good about making progress for self Mental Status Exam: Appearance:  Well Groomed     Behavior: Appropriate  Motor: Normal  Speech/Language:  Normal Rate  Affect: Appropriate  Mood: normal  Thought process: normal  Thought content:   WNL  Sensory/Perceptual disturbances:   WNL  Orientation: oriented to person, place, time/date, and situation  Attention: Good  Concentration: Good  Memory: WNL  Fund of knowledge:  Good  Insight:   Good  Judgment:  Good  Impulse Control: Good   Risk Assessment: Danger to Self:  No Self-injurious Behavior: No Danger to Others: No Duty to Warn:no Physical Aggression / Violence:No  Access to Firearms a concern: No  Gang Involvement:No   Subjective: Counselor assessed pt current functioning per pt report.  Processed w/pt interactions w/ husband and family.  Reflected pt steps w/ following through w/ setting boundaries for self and independence. Discussed positives w/ communication w/ husband separated from and adjusting to routines. Pt affect wnl and reported her mood has been good.  Pt reported that she bought a vehicle last week and returned vehicle dad had bought to him.  Pt reported dad didn't respond and didn't anticipate he would.  Pt reported that felt good to take steps she needed for herself.  Pt reported that had birthday party for daughter and attending son's games.  Pt reports communication w/ husband separated from and interactions together have been going well.    Interventions: Cognitive Behavioral Therapy, Assertiveness/Communication, Insight-Oriented, and  ACT  Diagnosis:Major depressive disorder, recurrent episode, moderate (HCC)  Plan: Pt to f/u 2 week sfor counseling to continue building coping skills for reducing depression.   Pt to f/u as scheduled w/ PCP for medication management.   Treatment Plan 10/14/20 Pt participated in development of plan and provided verbal consent Client Abilities/Strengths  supports: Best friend, mom at times, husband trying to help out more positives: returning to school to complete degree seeking tx for self  Client Treatment Preferences  pt to f/u w/ counseling every 1-2 weeks pt to continue w/ PCP for medication management.  Client Statement of Needs  "internal joy. I don't want to dread the day. " have some motivation- not push things to the next day".  Treatment Level  outpatient counseling  Symptoms  Depressed or irritable mood.: No Description Entered (Status: maintained). Diminished interest in or enjoyment of activities.: No Description Entered (Status: maintained). Feelings of hopelessness, worthlessness, or inappropriate guilt.: No Description Entered (Status: maintained). Lack of energy.: No Description Entered (Status: maintained). Low self-esteem.: No Description Entered (Status: maintained). Poor concentration and indecisiveness.: No Description Entered (Status: maintained). Sleeplessness or hypersomnia.: No Description Entered (Status: maintained).  Problems Addressed  Unipolar Depression  Goals 1. Alleviate depressive symptoms and return to previous level of effective functioning. Objective Implement mindfulness techniques for relapse prevention. Target Date: 2021-10-14 Frequency: Daily Progress: 0 Modality: individual Related Interventions 1. Use mindfulness meditation and cognitive therapy techniques to help the client learn to recognize and regulate the negative thought processes associated with depression and to change his/her relationship with these thoughts (see Mindfulness-Based  Cognitive Therapy for Depression by Chevis Pretty, and  Shona Simpson). Objective Increasingly verbalize hopeful and positive statements regarding self, others, and the future. Target Date: 2021-10-14 Frequency: Daily Progress: 0 Modality: individual Related Interventions 2. Assign the client to write at least one positive affirmation statement daily regarding himself/herself and the future (or assign "Positive Self-Talk" in the Adult Psychotherapy Homework Planner by Stephannie Li). Objective Learn and implement conflict resolution skills to resolve interpersonal problems. Target Date: 2021-10-14 Frequency: Daily Progress: 0 Modality: individual Related Interventions 3. Teach conflict resolution skills (e.g., empathy, active listening, "I messages," respectful communication, assertiveness without aggression, compromise); use psychoeducation, modeling, role-playing, and rehearsal to work through several current conflicts; assign homework exercises; review and repeat so as to integrate their use into the client's life. Objective Learn and implement behavioral strategies to overcome depression. Target Date: 2021-10-14 Frequency: Daily Progress: 0 Modality: individual Related Interventions 4. Engage the client in "behavioral activation," increasing his/her activity level and contact with sources of reward, while identifying processes that inhibit activation (see Behavioral Activation for Depression by Katharine Look, Dimidjian, and Herman-Dunn; or assign "Identify and Schedule Pleasant Activities" in the Adult Psychotherapy Homework Planner by [employer]); use behavioral techniques such as instruction, rehearsal, role-playing, role reversal, as needed, to facilitate activity in the client's daily life; reinforce success. Objective Identify and replace thoughts and beliefs that support depression. Target Date: 2021-10-14 Frequency: Daily Progress: 0 Modality: individual Related Interventions 5. Conduct  Cognitive-Behavioral Therapy (see Cognitive Behavior Therapy by Reola Calkins; Overcoming Depression by Agapito Games al.), beginning with helping the client learn the connection among cognition, depressive feelings, and actions.     Forde Radon, [facility]

## 2021-07-26 ENCOUNTER — Ambulatory Visit: Payer: BC Managed Care – PPO | Admitting: Psychology

## 2021-08-02 ENCOUNTER — Ambulatory Visit (INDEPENDENT_AMBULATORY_CARE_PROVIDER_SITE_OTHER): Payer: BC Managed Care – PPO | Admitting: Psychology

## 2021-08-02 DIAGNOSIS — F331 Major depressive disorder, recurrent, moderate: Secondary | ICD-10-CM

## 2021-08-02 NOTE — Progress Notes (Signed)
[place] Counselor/Therapist Progress Note  Patient ID: Wanda Hammond, MRN: [id number],    Date: 08/02/2021  Time Spent: 8:00am-8:48am   Treatment Type: Individual Therapy Pt is seen for virtual video visit via webex.  Pt joins from her home and counselor from her home office.   Reported Symptoms: Pt reported mood down and decreased motivation Mental Status Exam: Appearance:  Well Groomed     Behavior: Appropriate  Motor: Normal  Speech/Language:  Normal Rate  Affect: Appropriate and Congruent  Mood: depressed  Thought process: normal  Thought content:   WNL  Sensory/Perceptual disturbances:   WNL  Orientation: oriented to person, place, time/date, and situation  Attention: Good  Concentration: Good  Memory: WNL  Fund of knowledge:  Good  Insight:   Good  Judgment:  Good  Impulse Control: Good   Risk Assessment: Danger to Self:  No Self-injurious Behavior: No Danger to Others: No Duty to Warn:no Physical Aggression / Violence:No  Access to Firearms a concern: No  Gang Involvement:No   Subjective: Counselor assessed pt current functioning per pt report.  Processed w/pt increased depressed mood and related thoughts.  Assisted pt w/ identifying steps she has taken to defining who she is and process it takes.  Discussed focus on 1-2 things w/ being intentional w/ this next week.   Pt affect congruent w/ report of increased depressed mood and lack of motivation.   Pt reported that she has thought about how she has made all these changes and feels good about but also still struggling w/ what she is doing, who she is beyond being mom.  Pt was able to acknowledge steps she has taken and other steps taken.  Pt discussed how she is focusing on couple daily things to maintain and has felt good about.   Interventions: Cognitive Behavioral Therapy, Assertiveness/Communication, Insight-Oriented, and ACT  Diagnosis:Major depressive disorder, recurrent episode,  moderate (HCC)  Plan: Pt to f/u 2 week sfor counseling to continue building coping skills for reducing depression.   Pt to f/u as scheduled w/ PCP for medication management.   Treatment Plan 10/14/20 Pt participated in development of plan and provided verbal consent Client Abilities/Strengths  supports: Best friend, mom at times, husband trying to help out more positives: returning to school to complete degree seeking tx for self  Client Treatment Preferences  pt to f/u w/ counseling every 1-2 weeks pt to continue w/ PCP for medication management.  Client Statement of Needs  "internal joy. I don't want to dread the day. " have some motivation- not push things to the next day".  Treatment Level  outpatient counseling  Symptoms  Depressed or irritable mood.: No Description Entered (Status: maintained). Diminished interest in or enjoyment of activities.: No Description Entered (Status: maintained). Feelings of hopelessness, worthlessness, or inappropriate guilt.: No Description Entered (Status: maintained). Lack of energy.: No Description Entered (Status: maintained). Low self-esteem.: No Description Entered (Status: maintained). Poor concentration and indecisiveness.: No Description Entered (Status: maintained). Sleeplessness or hypersomnia.: No Description Entered (Status: maintained).  Problems Addressed  Unipolar Depression  Goals 1. Alleviate depressive symptoms and return to previous level of effective functioning. Objective Implement mindfulness techniques for relapse prevention. Target Date: 2021-10-14 Frequency: Daily Progress: 0 Modality: individual Related Interventions 1. Use mindfulness meditation and cognitive therapy techniques to help the client learn to recognize and regulate the negative thought processes associated with depression and to change his/her relationship with these thoughts (see Mindfulness-Based Cognitive Therapy for Depression by Walden Field, and  Clyde Canterbury). Objective Increasingly verbalize hopeful and positive statements regarding self, others, and the future. Target Date: 2021-10-14 Frequency: Daily Progress: 0 Modality: individual Related Interventions 2. Assign the client to write at least one positive affirmation statement daily regarding himself/herself and the future (or assign "Positive Self-Talk" in the Adult Psychotherapy Homework Planner by Bryn Gulling). Objective Learn and implement conflict resolution skills to resolve interpersonal problems. Target Date: 2021-10-14 Frequency: Daily Progress: 0 Modality: individual Related Interventions 3. Teach conflict resolution skills (e.g., empathy, active listening, "I messages," respectful communication, assertiveness without aggression, compromise); use psychoeducation, modeling, role-playing, and rehearsal to work through several current conflicts; assign homework exercises; review and repeat so as to integrate their use into the client's life. Objective Learn and implement behavioral strategies to overcome depression. Target Date: 2021-10-14 Frequency: Daily Progress: 0 Modality: individual Related Interventions 4. Engage the client in "behavioral activation," increasing his/her activity level and contact with sources of reward, while identifying processes that inhibit activation (see Behavioral Activation for Depression by Beverly Gust, Dimidjian, and Herman-Dunn; or assign "Identify and Schedule Pleasant Activities" in the Adult Psychotherapy Homework Planner by [facility]); use behavioral techniques such as instruction, rehearsal, role-playing, role reversal, as needed, to facilitate activity in the client's daily life; reinforce success. Objective Identify and replace thoughts and beliefs that support depression. Target Date: 2021-10-14 Frequency: Daily Progress: 0 Modality: individual Related Interventions 5. Conduct Cognitive-Behavioral Therapy (see Cognitive Behavior Therapy by Olevia Bowens;  Overcoming Depression by Lynita Lombard al.), beginning with helping the client learn the connection among cognition, depressive feelings, and actions.     Wanda Hammond [facility]               [place], [facility]

## 2021-08-09 ENCOUNTER — Ambulatory Visit (INDEPENDENT_AMBULATORY_CARE_PROVIDER_SITE_OTHER): Payer: BC Managed Care – PPO | Admitting: Psychology

## 2021-08-09 DIAGNOSIS — F331 Major depressive disorder, recurrent, moderate: Secondary | ICD-10-CM | POA: Diagnosis not present

## 2021-08-09 NOTE — Progress Notes (Signed)
[facility] Counselor/Therapist Progress Note  Patient ID: Wanda Hammond, MRN: [id number],    Date: 08/09/2021  Time Spent: 8:05am-8:51am   Treatment Type: Individual Therapy Pt is seen for virtual video visit via webex.  Pt joins from her home and counselor from her home office.   Reported Symptoms: Pt reported assertive communication, improved motivation last week Mental Status Exam: Appearance:  Well Groomed     Behavior: Appropriate  Motor: Normal  Speech/Language:  Normal Rate  Affect: Appropriate and Congruent  Mood: depressed  Thought process: normal  Thought content:   WNL  Sensory/Perceptual disturbances:   WNL  Orientation: oriented to person, place, time/date, and situation  Attention: Good  Concentration: Good  Memory: WNL  Fund of knowledge:  Good  Insight:   Good  Judgment:  Good  Impulse Control: Good   Risk Assessment: Danger to Self:  No Self-injurious Behavior: No Danger to Others: No Duty to Warn:no Physical Aggression / Violence:No  Access to Firearms a concern: No  Gang Involvement:No   Subjective: Counselor assessed pt current functioning per pt report.  Processed w/pt interaction w/ dad and assertive communication and how felt good about.  Explored engagement this past week and acknowledging positive steps taken and next steps.  Discussed all or nothing thinking and reframing.   Pt affect wnl.  Pt reported on improved w/ engaging last week and getting things done.  Pt reported that talked w/ dad and he made attempts to guilt trip pt and pt was able to be assertive and state boundaries and wants w/ engaging.  Pt discussed how reading is her positive but also can use to avoid and escape.  Pt discussed continuing to develop areas of interest and working in routine.   Interventions: Cognitive Behavioral Therapy, Assertiveness/Communication, Insight-Oriented, and ACT  Diagnosis:Major depressive disorder, recurrent episode, moderate  ([facility])  Plan: Pt to f/u 2 week sfor counseling to continue building coping skills for reducing depression.   Pt to f/u as scheduled w/ PCP for medication management.   Treatment Plan 10/14/20 Pt participated in development of plan and provided verbal consent Client Abilities/Strengths  supports: Best friend, mom at times, husband trying to help out more positives: returning to school to complete degree seeking tx for self  Client Treatment Preferences  pt to f/u w/ counseling every 1-2 weeks pt to continue w/ PCP for medication management.  Client Statement of Needs  "internal joy. I don't want to dread the day. " have some motivation- not push things to the next day".  Treatment Level  outpatient counseling  Symptoms  Depressed or irritable mood.: No Description Entered (Status: maintained). Diminished interest in or enjoyment of activities.: No Description Entered (Status: maintained). Feelings of hopelessness, worthlessness, or inappropriate guilt.: No Description Entered (Status: maintained). Lack of energy.: No Description Entered (Status: maintained). Low self-esteem.: No Description Entered (Status: maintained). Poor concentration and indecisiveness.: No Description Entered (Status: maintained). Sleeplessness or hypersomnia.: No Description Entered (Status: maintained).  Problems Addressed  Unipolar Depression  Goals 1. Alleviate depressive symptoms and return to previous level of effective functioning. Objective Implement mindfulness techniques for relapse prevention. Target Date: 2021-10-14 Frequency: Daily Progress: 0 Modality: individual Related Interventions 1. Use mindfulness meditation and cognitive therapy techniques to help the client learn to recognize and regulate the negative thought processes associated with depression and to change his/her relationship with these thoughts (see Mindfulness-Based Cognitive Therapy for Depression by Chevis Pretty, and  Shona Simpson). Objective Increasingly verbalize hopeful and positive statements regarding  self, others, and the future. Target Date: 2021-10-14 Frequency: Daily Progress: 0 Modality: individual Related Interventions 2. Assign the client to write at least one positive affirmation statement daily regarding himself/herself and the future (or assign "Positive Self-Talk" in the Adult Psychotherapy Homework Planner by Stephannie Li). Objective Learn and implement conflict resolution skills to resolve interpersonal problems. Target Date: 2021-10-14 Frequency: Daily Progress: 0 Modality: individual Related Interventions 3. Teach conflict resolution skills (e.g., empathy, active listening, "I messages," respectful communication, assertiveness without aggression, compromise); use psychoeducation, modeling, role-playing, and rehearsal to work through several current conflicts; assign homework exercises; review and repeat so as to integrate their use into the client's life. Objective Learn and implement behavioral strategies to overcome depression. Target Date: 2021-10-14 Frequency: Daily Progress: 0 Modality: individual Related Interventions 4. Engage the client in "behavioral activation," increasing his/her activity level and contact with sources of reward, while identifying processes that inhibit activation (see Behavioral Activation for Depression by Katharine Look, Dimidjian, and Herman-Dunn; or assign "Identify and Schedule Pleasant Activities" in the Adult Psychotherapy Homework Planner by [facility]); use behavioral techniques such as instruction, rehearsal, role-playing, role reversal, as needed, to facilitate activity in the client's daily life; reinforce success. Objective Identify and replace thoughts and beliefs that support depression. Target Date: 2021-10-14 Frequency: Daily Progress: 0 Modality: individual Related Interventions 5. Conduct Cognitive-Behavioral Therapy (see Cognitive Behavior Therapy by Reola Calkins;  Overcoming Depression by Agapito Games al.), beginning with helping the client learn the connection among cognition, depressive feelings, and actions.   Forde Radon, [facility]

## 2021-08-16 ENCOUNTER — Ambulatory Visit (INDEPENDENT_AMBULATORY_CARE_PROVIDER_SITE_OTHER): Payer: BC Managed Care – PPO | Admitting: Psychology

## 2021-08-16 DIAGNOSIS — F331 Major depressive disorder, recurrent, moderate: Secondary | ICD-10-CM

## 2021-08-16 NOTE — Progress Notes (Signed)
[facility] Counselor/Therapist Progress Note ? ?Patient ID: Wanda Hammond, MRN: [id number],   ? ?Date: 08/16/2021 ? ?Time Spent: 8:06am-8:55am  ? ?Treatment Type: Individual Therapy Pt is seen for virtual video visit via webex.  Pt joins from her car outside of work and counselor from her home office.  ? ?Reported Symptoms: Pt reported continued improved mood and adjusting to coparenting ?Mental Status Exam: ?Appearance:  Well Groomed     ?Behavior: Appropriate  ?Motor: Normal  ?Speech/Language:  Normal Rate  ?Affect: Appropriate  ?Mood: normal  ?Thought process: normal  ?Thought content:   WNL  ?Sensory/Perceptual disturbances:   WNL  ?Orientation: oriented to person, place, time/date, and situation  ?Attention: Good  ?Concentration: Good  ?Memory: WNL  ?Fund of knowledge:  Good  ?Insight:   Good  ?Judgment:  Good  ?Impulse Control: Good  ? ?Risk Assessment: ?Danger to Self:  No ?Self-injurious Behavior: No ?Danger to Others: No ?Duty to Warn:no ?Physical Aggression / Violence:No  ?Access to Firearms a concern: No  ?Gang Involvement:No  ? ?Subjective: Counselor assessed pt current functioning per pt report.  Processed w/pt mood and interactions w/ coparenting.  Validated and normalized feelings.  Explored continued engagement this past week.  Encouraged continued effective communication.  Pt affect wnl.  Pt reported on that mood is improved and continuing to stay engaged.  Pt reported on frustrations w/ coparenting and was able to normalize and discussed communication skills and resolution skills.  Pt discussed ways of creating routine and being intentional about things that are important to her.  ? ?Interventions: Cognitive Behavioral Therapy, Assertiveness/Communication, Insight-Oriented, and ACT ? ?Diagnosis:Major depressive disorder, recurrent episode, moderate ([place]) ? ?Plan: Pt to f/u 1 week sfor counseling to continue building coping skills for reducing depression.   Pt to f/u as  scheduled w/ PCP for medication management.  ? ?Treatment Plan 10/14/20 ?Pt participated in development of plan and provided verbal consent ?Client Abilities/Strengths  ?supports: Best friend, mom at times, husband trying to help out more positives: returning to school to complete degree seeking tx for self  ?Client Treatment Preferences  ?pt to f/u w/ counseling every 1-2 weeks pt to continue w/ PCP for medication management.  ?Client Statement of Needs  ?"internal joy. I don't want to dread the day. " have some motivation- not push things to the next day".  ?Treatment Level  ?outpatient counseling  ?Symptoms  ?Depressed or irritable mood.: No Description Entered (Status: maintained). Diminished interest in or enjoyment of activities.: No Description Entered (Status: maintained). Feelings of hopelessness, worthlessness, or inappropriate guilt.: No Description Entered (Status: maintained). Lack of energy.: No Description Entered (Status: maintained). Low self-esteem.: No Description Entered (Status: maintained). Poor concentration and indecisiveness.: No Description Entered (Status: maintained). Sleeplessness or hypersomnia.: No Description Entered (Status: maintained).  ?Problems Addressed  ?Unipolar Depression  ?Goals ?1. Alleviate depressive symptoms and return to previous level of effective functioning. ?Objective ?Implement mindfulness techniques for relapse prevention. ?Target Date: 2021-10-14 Frequency: Daily ?Progress: 0 Modality: individual ?Related Interventions ?1. Use mindfulness meditation and cognitive therapy techniques to help the client learn to recognize and regulate the negative thought processes associated with depression and to change his/her relationship with these thoughts (see Mindfulness-Based Cognitive Therapy for Depression by Walden Field, and Clyde Canterbury). ?Objective ?Increasingly verbalize hopeful and positive statements regarding self, others, and the future. ?Target Date:  2021-10-14 Frequency: Daily ?Progress: 0 Modality: individual ?Related Interventions ?2. Assign the client to write at least one positive affirmation statement daily regarding himself/herself and  the future (or assign "Positive Self-Talk" in the Adult Psychotherapy Homework Planner by Bryn Gulling). ?Objective ?Learn and implement conflict resolution skills to resolve interpersonal problems. ?Target Date: 2021-10-14 Frequency: Daily ?Progress: 0 Modality: individual ?Related Interventions ?3. Teach conflict resolution skills (e.g., empathy, active listening, "I messages," respectful communication, assertiveness without aggression, compromise); use psychoeducation, modeling, role-playing, and rehearsal to work through several current conflicts; assign homework exercises; review and repeat so as to integrate their use into the client's life. ?Objective ?Learn and implement behavioral strategies to overcome depression. ?Target Date: 2021-10-14 Frequency: Daily ?Progress: 0 Modality: individual ?Related Interventions ?4. Engage the client in "behavioral activation," increasing his/her activity level and contact with sources of reward, while identifying processes that inhibit activation (see Behavioral Activation for Depression by Beverly Gust, Dimidjian, and Herman-Dunn; or assign "Identify and Schedule Pleasant Activities" in the Adult Psychotherapy Homework Planner by [facility]); use behavioral techniques such as instruction, rehearsal, role-playing, role reversal, as needed, to facilitate activity in the client's daily life; reinforce success. ?Objective ?Identify and replace thoughts and beliefs that support depression. ?Target Date: 2021-10-14 Frequency: Daily ?Progress: 0 Modality: individual ?Related Interventions ?5. Conduct Cognitive-Behavioral Therapy (see Cognitive Behavior Therapy by Olevia Bowens; Overcoming Depression by Lynita Lombard al.), beginning with helping the client learn the connection among cognition, depressive  feelings, and actions. ? ? ? Jan Fireman, [facility] ?

## 2021-08-23 ENCOUNTER — Ambulatory Visit (INDEPENDENT_AMBULATORY_CARE_PROVIDER_SITE_OTHER): Payer: BC Managed Care – PPO | Admitting: Psychology

## 2021-08-23 DIAGNOSIS — F331 Major depressive disorder, recurrent, moderate: Secondary | ICD-10-CM

## 2021-08-23 NOTE — Progress Notes (Signed)
[facility] Counselor/Therapist Progress Note ? ?Patient ID: Wanda Hammond, MRN: [id number],   ? ?Date: 08/23/2021 ? ?Time Spent: 8:00am-8:50am  ? ?Treatment Type: Individual Therapy Pt is seen for virtual video visit via webex.  Pt joins from her car outside of work and counselor from her home office.  ? ?Reported Symptoms: Pt reported fatigue, increasing awareness of boundaries and adjusting to coparenting ?Mental Status Exam: ?Appearance:  Well Groomed     ?Behavior: Appropriate  ?Motor: Normal  ?Speech/Language:  Normal Rate  ?Affect: Appropriate  ?Mood: normal  ?Thought process: normal  ?Thought content:   WNL  ?Sensory/Perceptual disturbances:   WNL  ?Orientation: oriented to person, place, time/date, and situation  ?Attention: Good  ?Concentration: Good  ?Memory: WNL  ?Fund of knowledge:  Good  ?Insight:   Good  ?Judgment:  Good  ?Impulse Control: Good  ? ?Risk Assessment: ?Danger to Self:  No ?Self-injurious Behavior: No ?Danger to Others: No ?Duty to Warn:no ?Physical Aggression / Violence:No  ?Access to Firearms a concern: No  ?Gang Involvement:No  ? ?Subjective: Counselor assessed pt current functioning per pt report.  Processed w/pt stressors and interactions w/ coparenting.  Validated and normalized feelings.  Discussed communication and boundaries in her relationships.  Pt affect wnl.  Pt reported some fatigue, but able to set goal for week and follow through on.  Pt reported on stressors w/ finding balance w/ work, home and kids.  Pt discussed some frustrations w/ coparenting and recognizing differing perspectives.  Pt discussed comments from mom and how hurtful but not internalizing.  Pt discussed ways of setting boundaries and recognizing need to do this in her relationship w/ mom.     ? ?Interventions: Cognitive Behavioral Therapy, Assertiveness/Communication, Insight-Oriented, and ACT ? ?Diagnosis:Major depressive disorder, recurrent episode, moderate (HCC) ? ?Plan: Pt to  f/u 1 week sfor counseling to continue building coping skills for reducing depression.   Pt to f/u as scheduled w/ PCP for medication management.  ? ?Treatment Plan 10/14/20 ?Pt participated in development of plan and provided verbal consent ?Client Abilities/Strengths  ?supports: Best friend, mom at times, husband trying to help out more positives: returning to school to complete degree seeking tx for self  ?Client Treatment Preferences  ?pt to f/u w/ counseling every 1-2 weeks pt to continue w/ PCP for medication management.  ?Client Statement of Needs  ?"internal joy. I don't want to dread the day. " have some motivation- not push things to the next day".  ?Treatment Level  ?outpatient counseling  ?Symptoms  ?Depressed or irritable mood.: No Description Entered (Status: maintained). Diminished interest in or enjoyment of activities.: No Description Entered (Status: maintained). Feelings of hopelessness, worthlessness, or inappropriate guilt.: No Description Entered (Status: maintained). Lack of energy.: No Description Entered (Status: maintained). Low self-esteem.: No Description Entered (Status: maintained). Poor concentration and indecisiveness.: No Description Entered (Status: maintained). Sleeplessness or hypersomnia.: No Description Entered (Status: maintained).  ?Problems Addressed  ?Unipolar Depression  ?Goals ?1. Alleviate depressive symptoms and return to previous level of effective functioning. ?Objective ?Implement mindfulness techniques for relapse prevention. ?Target Date: 2021-10-14 Frequency: Daily ?Progress: 0 Modality: individual ?Related Interventions ?1. Use mindfulness meditation and cognitive therapy techniques to help the client learn to recognize and regulate the negative thought processes associated with depression and to change his/her relationship with these thoughts (see Mindfulness-Based Cognitive Therapy for Depression by Chevis Pretty, and Shona Simpson). ?Objective ?Increasingly  verbalize hopeful and positive statements regarding self, others, and the future. ?Target Date: 2021-10-14 Frequency: Daily ?  Progress: 0 Modality: individual ?Related Interventions ?2. Assign the client to write at least one positive affirmation statement daily regarding himself/herself and the future (or assign "Positive Self-Talk" in the Adult Psychotherapy Homework Planner by Stephannie Li). ?Objective ?Learn and implement conflict resolution skills to resolve interpersonal problems. ?Target Date: 2021-10-14 Frequency: Daily ?Progress: 0 Modality: individual ?Related Interventions ?3. Teach conflict resolution skills (e.g., empathy, active listening, "I messages," respectful communication, assertiveness without aggression, compromise); use psychoeducation, modeling, role-playing, and rehearsal to work through several current conflicts; assign homework exercises; review and repeat so as to integrate their use into the client's life. ?Objective ?Learn and implement behavioral strategies to overcome depression. ?Target Date: 2021-10-14 Frequency: Daily ?Progress: 0 Modality: individual ?Related Interventions ?4. Engage the client in "behavioral activation," increasing his/her activity level and contact with sources of reward, while identifying processes that inhibit activation (see Behavioral Activation for Depression by Katharine Look, Dimidjian, and Herman-Dunn; or assign "Identify and Schedule Pleasant Activities" in the Adult Psychotherapy Homework Planner by [facility]); use behavioral techniques such as instruction, rehearsal, role-playing, role reversal, as needed, to facilitate activity in the client's daily life; reinforce success. ?Objective ?Identify and replace thoughts and beliefs that support depression. ?Target Date: 2021-10-14 Frequency: Daily ?Progress: 0 Modality: individual ?Related Interventions ?5. Conduct Cognitive-Behavioral Therapy (see Cognitive Behavior Therapy by Reola Calkins; Overcoming Depression by Agapito Games  al.), beginning with helping the client learn the connection among cognition, depressive feelings, and actions. ? ? ? ? Forde Radon, [facility] ?

## 2021-08-30 ENCOUNTER — Ambulatory Visit: Payer: BC Managed Care – PPO | Admitting: Psychology

## 2021-09-06 ENCOUNTER — Ambulatory Visit: Payer: BC Managed Care – PPO | Admitting: Psychology

## 2021-09-13 ENCOUNTER — Ambulatory Visit (INDEPENDENT_AMBULATORY_CARE_PROVIDER_SITE_OTHER): Payer: BC Managed Care – PPO | Admitting: Psychology

## 2021-09-13 DIAGNOSIS — F331 Major depressive disorder, recurrent, moderate: Secondary | ICD-10-CM

## 2021-09-13 NOTE — Progress Notes (Signed)
[facility] Counselor/Therapist Progress Note ? ?Patient ID: Wanda Hammond, MRN: [id number],   ? ?Date: 09/13/2021 ? ?Time Spent: 8:00am-8:49am  ? ?Treatment Type: Individual Therapy Pt is seen for virtual video visit via webex.  Pt joins from her car outside of work and counselor from her home office.  ? ?Reported Symptoms: Pt reported mood good, communication good ?Mental Status Exam: ?Appearance:  Well Groomed     ?Behavior: Appropriate  ?Motor: Normal  ?Speech/Language:  Normal Rate  ?Affect: Appropriate  ?Mood: normal  ?Thought process: normal  ?Thought content:   WNL  ?Sensory/Perceptual disturbances:   WNL  ?Orientation: oriented to person, place, time/date, and situation  ?Attention: Good  ?Concentration: Good  ?Memory: WNL  ?Fund of knowledge:  Good  ?Insight:   Good  ?Judgment:  Good  ?Impulse Control: Good  ? ?Risk Assessment: ?Danger to Self:  No ?Self-injurious Behavior: No ?Danger to Others: No ?Duty to Warn:no ?Physical Aggression / Violence:No  ?Access to Firearms a concern: No  ?Gang Involvement:No  ? ?Subjective: Counselor assessed pt current functioning per pt report.  Processed w/pt stressors and positives.  Reflected positive of focus on consistency w/ changes establishing.  Explored w/ pt work stressors and how coping.  Pt affect wnl.  Pt reported some fatigue, but mood good- not down and depressed.  Pt reported that her communication w/ ex is going well and keeping boundaries of communicating on children.  Pt discussed focus for self on keeping consistent w/ changes in routine and self care that establishing.  Pt reported feeling good about this and giving self grace when not 100%.  Pt reported on some work stress w/ being short staffed.  Pt discussed perspective for self and advocating through HR when needed and acknowledging can be place to gain experience needed for next steps.  ?Interventions: Cognitive Behavioral Therapy, Assertiveness/Communication,  Insight-Oriented, and ACT ? ?Diagnosis:Major depressive disorder, recurrent episode, moderate (HCC) ? ?Plan: Pt to f/u 1 week sfor counseling to continue building coping skills for reducing depression.   Pt to f/u as scheduled w/ PCP for medication management.  ? ?Treatment Plan 10/14/20 ?Pt participated in development of plan and provided verbal consent ?Client Abilities/Strengths  ?supports: Best friend, mom at times, husband trying to help out more positives: returning to school to complete degree seeking tx for self  ?Client Treatment Preferences  ?pt to f/u w/ counseling every 1-2 weeks pt to continue w/ PCP for medication management.  ?Client Statement of Needs  ?"internal joy. I don't want to dread the day. " have some motivation- not push things to the next day".  ?Treatment Level  ?outpatient counseling  ?Symptoms  ?Depressed or irritable mood.: No Description Entered (Status: maintained). Diminished interest in or enjoyment of activities.: No Description Entered (Status: maintained). Feelings of hopelessness, worthlessness, or inappropriate guilt.: No Description Entered (Status: maintained). Lack of energy.: No Description Entered (Status: maintained). Low self-esteem.: No Description Entered (Status: maintained). Poor concentration and indecisiveness.: No Description Entered (Status: maintained). Sleeplessness or hypersomnia.: No Description Entered (Status: maintained).  ?Problems Addressed  ?Unipolar Depression  ?Goals ?1. Alleviate depressive symptoms and return to previous level of effective functioning. ?Objective ?Implement mindfulness techniques for relapse prevention. ?Target Date: 2021-10-14 Frequency: Daily ?Progress: 0 Modality: individual ?Related Interventions ?1. Use mindfulness meditation and cognitive therapy techniques to help the client learn to recognize and regulate the negative thought processes associated with depression and to change his/her relationship with these thoughts (see  Mindfulness-Based Cognitive Therapy for Depression by  Chevis Pretty, and [place]). ?Objective ?Increasingly verbalize hopeful and positive statements regarding self, others, and the future. ?Target Date: 2021-10-14 Frequency: Daily ?Progress: 0 Modality: individual ?Related Interventions ?2. Assign the client to write at least one positive affirmation statement daily regarding himself/herself and the future (or assign "Positive Self-Talk" in the Adult Psychotherapy Homework Planner by Stephannie Li). ?Objective ?Learn and implement conflict resolution skills to resolve interpersonal problems. ?Target Date: 2021-10-14 Frequency: Daily ?Progress: 0 Modality: individual ?Related Interventions ?3. Teach conflict resolution skills (e.g., empathy, active listening, "I messages," respectful communication, assertiveness without aggression, compromise); use psychoeducation, modeling, role-playing, and rehearsal to work through several current conflicts; assign homework exercises; review and repeat so as to integrate their use into the client's life. ?Objective ?Learn and implement behavioral strategies to overcome depression. ?Target Date: 2021-10-14 Frequency: Daily ?Progress: 0 Modality: individual ?Related Interventions ?4. Engage the client in "behavioral activation," increasing his/her activity level and contact with sources of reward, while identifying processes that inhibit activation (see Behavioral Activation for Depression by Katharine Look, Dimidjian, and Herman-Dunn; or assign "Identify and Schedule Pleasant Activities" in the Adult Psychotherapy Homework Planner by [facility]); use behavioral techniques such as instruction, rehearsal, role-playing, role reversal, as needed, to facilitate activity in the client's daily life; reinforce success. ?Objective ?Identify and replace thoughts and beliefs that support depression. ?Target Date: 2021-10-14 Frequency: Daily ?Progress: 0 Modality: individual ?Related  Interventions ?5. Conduct Cognitive-Behavioral Therapy (see Cognitive Behavior Therapy by Reola Calkins; Overcoming Depression by Agapito Games al.), beginning with helping the client learn the connection among cognition, depressive feelings, and actions. ? ? ? ? ?Forde Radon, [facility] ? ? ? ? ? ? ? ? ? ? ? ? ? ? Forde Radon, [facility] ?

## 2021-09-27 ENCOUNTER — Ambulatory Visit (INDEPENDENT_AMBULATORY_CARE_PROVIDER_SITE_OTHER): Payer: BC Managed Care – PPO | Admitting: Psychology

## 2021-09-27 DIAGNOSIS — F331 Major depressive disorder, recurrent, moderate: Secondary | ICD-10-CM | POA: Diagnosis not present

## 2021-09-27 NOTE — Progress Notes (Signed)
? ? ? ?  [facility] Counselor/Therapist Progress Note ? ?Patient ID: Wanda Hammond, MRN: [id number],   ? ?Date: 09/27/2021 ? ?Time Spent: 8:14am-8:55am  ? ?Treatment Type: Individual Therapy Pt is seen for virtual video visit via caregility.  Pt joins from her car outside of work and counselor from her home office.  ? ?Reported Symptoms: Pt reported mood good, improving communication ?Mental Status Exam: ?Appearance:  Well Groomed     ?Behavior: Appropriate  ?Motor: Normal  ?Speech/Language:  Normal Rate  ?Affect: Appropriate  ?Mood: normal  ?Thought process: normal  ?Thought content:   WNL  ?Sensory/Perceptual disturbances:   WNL  ?Orientation: oriented to person, place, time/date, and situation  ?Attention: Good  ?Concentration: Good  ?Memory: WNL  ?Fund of knowledge:  Good  ?Insight:   Good  ?Judgment:  Good  ?Impulse Control: Good  ? ?Risk Assessment: ?Danger to Self:  No ?Self-injurious Behavior: No ?Danger to Others: No ?Duty to Warn:no ?Physical Aggression / Violence:No  ?Access to Firearms a concern: No  ?Gang Involvement:No  ? ?Subjective: Counselor assessed pt current functioning per pt report.  Processed w/pt recent stressors and conflict.   Reflected positive w/ not reacting w/ passive aggressive responses and exploring what she wants to communicate w/ mom.  Explored boundaries setting and how to communicate.  Pt affect wnl.  Pt reported engaged w/ kids and activities and making decisions for summer and next year.  Pt reported some conflict w/ mom- mom expressed feeling neglected and ultimatum to move back to [place]. Pt recognized that reacting to comments in moment would have likely been passive aggressive.  Pt discussed boundaries and ways she wants to communicate and recognize not responsible for mom's happiness.  Pt discussed decisions she is making for self and exercises self control and working through some disappointment.    ? ?Interventions: Cognitive Behavioral Therapy,  Assertiveness/Communication, Insight-Oriented, and ACT ? ?Diagnosis:Major depressive disorder, recurrent episode, moderate (HCC) ? ?Plan: Pt to f/u 1 week sfor counseling to continue building coping skills for reducing depression.   Pt to f/u as scheduled w/ PCP for medication management.  ? ?Treatment Plan 10/14/20 ?Pt participated in development of plan and provided verbal consent ?Client Abilities/Strengths  ?supports: Best friend, mom at times, husband trying to help out more positives: returning to school to complete degree seeking tx for self  ?Client Treatment Preferences  ?pt to f/u w/ counseling every 1-2 weeks pt to continue w/ PCP for medication management.  ?Client Statement of Needs  ?"internal joy. I don't want to dread the day. " have some motivation- not push things to the next day".  ?Treatment Level  ?outpatient counseling  ?Symptoms  ?Depressed or irritable mood.: No Description Entered (Status: maintained). Diminished interest in or enjoyment of activities.: No Description Entered (Status: maintained). Feelings of hopelessness, worthlessness, or inappropriate guilt.: No Description Entered (Status: maintained). Lack of energy.: No Description Entered (Status: maintained). Low self-esteem.: No Description Entered (Status: maintained). Poor concentration and indecisiveness.: No Description Entered (Status: maintained). Sleeplessness or hypersomnia.: No Description Entered (Status: maintained).  ?Problems Addressed  ?Unipolar Depression  ?Goals ?1. Alleviate depressive symptoms and return to previous level of effective functioning. ?Objective ?Implement mindfulness techniques for relapse prevention. ?Target Date: 2021-10-14 Frequency: Daily ?Progress: 0 Modality: individual ?Related Interventions ?1. Use mindfulness meditation and cognitive therapy techniques to help the client learn to recognize and regulate the negative thought processes associated with depression and to change his/her relationship  with these thoughts (see Mindfulness-Based Cognitive  Therapy for Depression by Chevis Pretty, and Shona Simpson). ?Objective ?Increasingly verbalize hopeful and positive statements regarding self, others, and the future. ?Target Date: 2021-10-14 Frequency: Daily ?Progress: 0 Modality: individual ?Related Interventions ?2. Assign the client to write at least one positive affirmation statement daily regarding himself/herself and the future (or assign "Positive Self-Talk" in the Adult Psychotherapy Homework Planner by Stephannie Li). ?Objective ?Learn and implement conflict resolution skills to resolve interpersonal problems. ?Target Date: 2021-10-14 Frequency: Daily ?Progress: 0 Modality: individual ?Related Interventions ?3. Teach conflict resolution skills (e.g., empathy, active listening, "I messages," respectful communication, assertiveness without aggression, compromise); use psychoeducation, modeling, role-playing, and rehearsal to work through several current conflicts; assign homework exercises; review and repeat so as to integrate their use into the client's life. ?Objective ?Learn and implement behavioral strategies to overcome depression. ?Target Date: 2021-10-14 Frequency: Daily ?Progress: 0 Modality: individual ?Related Interventions ?4. Engage the client in "behavioral activation," increasing his/her activity level and contact with sources of reward, while identifying processes that inhibit activation (see Behavioral Activation for Depression by Katharine Look, Dimidjian, and Herman-Dunn; or assign "Identify and Schedule Pleasant Activities" in the Adult Psychotherapy Homework Planner by [facility]); use behavioral techniques such as instruction, rehearsal, role-playing, role reversal, as needed, to facilitate activity in the client's daily life; reinforce success. ?Objective ?Identify and replace thoughts and beliefs that support depression. ?Target Date: 2021-10-14 Frequency: Daily ?Progress: 0 Modality:  individual ?Related Interventions ?5. Conduct Cognitive-Behavioral Therapy (see Cognitive Behavior Therapy by Reola Calkins; Overcoming Depression by Agapito Games al.), beginning with helping the client learn the connection among cognition, depressive feelings, and actions. ? ? ? Forde Radon, [facility] ?

## 2021-10-04 ENCOUNTER — Ambulatory Visit (INDEPENDENT_AMBULATORY_CARE_PROVIDER_SITE_OTHER): Payer: BC Managed Care – PPO | Admitting: Psychology

## 2021-10-04 DIAGNOSIS — F331 Major depressive disorder, recurrent, moderate: Secondary | ICD-10-CM

## 2021-10-04 NOTE — Progress Notes (Signed)
? ? ? ?  [facility] Counselor/Therapist Progress Note ? ?Patient ID: Wanda Hammond, MRN: [id number],   ? ?Date: 10/04/2021 ? ?Time Spent: 8:00am-8:48am  ? ?Treatment Type: Individual Therapy Pt is seen for virtual video visit via caregility.  Pt joins from her car outside of work and counselor from her home office.  ? ?Reported Symptoms: Pt reported feeling overwhelmed ?Appearance:  Well Groomed     ?Behavior: Appropriate  ?Motor: Normal  ?Speech/Language:  Normal Rate  ?Affect: Appropriate  ?Mood: normal  ?Thought process: normal  ?Thought content:   WNL  ?Sensory/Perceptual disturbances:   WNL  ?Orientation: oriented to person, place, time/date, and situation  ?Attention: Good  ?Concentration: Good  ?Memory: WNL  ?Fund of knowledge:  Good  ?Insight:   Good  ?Judgment:  Good  ?Impulse Control: Good  ? ?Risk Assessment: ?Danger to Self:  No ?Self-injurious Behavior: No ?Danger to Others: No ?Duty to Warn:no ?Physical Aggression / Violence:No  ?Access to Firearms a concern: No  ?Gang Involvement:No  ? ?Subjective: Counselor assessed pt current functioning per pt report.  Processed w/pt recent stressors and feeling overwhelmed.  Discussed positives of communication w/ mom and asserting.  Explored how she is taking care of self and need to put as priority.  Explored ways can set boundaries w/ not taking more on or saying no.    Pt affect wnl.  Pt reported was able to talk w/ mom and felt good about.  Pt reported tired over past weekend and recognized feeling overwhelmed of not having time for self- even weekend w/out kids spending at kids' acvitities.  Pt acknowledged that needs time for self care and explored ways for this.  Pt discussed difficulty w/ saying no and identified that might not need to attend all son's games.   ? ?Interventions: Cognitive Behavioral Therapy, Assertiveness/Communication, Insight-Oriented, and ACT ? ?Diagnosis:Major depressive disorder, recurrent episode, moderate  (HCC) ? ?Plan: Pt to f/u 1 week for counseling to continue building coping skills for reducing depression.   Pt to f/u as scheduled w/ PCP for medication management.  ? ?Treatment Plan 10/14/20 ?Pt participated in development of plan and provided verbal consent ?Client Abilities/Strengths  ?supports: Best friend, mom at times, husband trying to help out more positives: returning to school to complete degree seeking tx for self  ?Client Treatment Preferences  ?pt to f/u w/ counseling every 1-2 weeks pt to continue w/ PCP for medication management.  ?Client Statement of Needs  ?"internal joy. I don't want to dread the day. " have some motivation- not push things to the next day".  ?Treatment Level  ?outpatient counseling  ?Symptoms  ?Depressed or irritable mood.: No Description Entered (Status: maintained). Diminished interest in or enjoyment of activities.: No Description Entered (Status: maintained). Feelings of hopelessness, worthlessness, or inappropriate guilt.: No Description Entered (Status: maintained). Lack of energy.: No Description Entered (Status: maintained). Low self-esteem.: No Description Entered (Status: maintained). Poor concentration and indecisiveness.: No Description Entered (Status: maintained). Sleeplessness or hypersomnia.: No Description Entered (Status: maintained).  ?Problems Addressed  ?Unipolar Depression  ?Goals ?1. Alleviate depressive symptoms and return to previous level of effective functioning. ?Objective ?Implement mindfulness techniques for relapse prevention. ?Target Date: 2021-10-14 Frequency: Daily ?Progress: 0 Modality: individual ?Related Interventions ?1. Use mindfulness meditation and cognitive therapy techniques to help the client learn to recognize and regulate the negative thought processes associated with depression and to change his/her relationship with these thoughts (see Mindfulness-Based Cognitive Therapy for Depression by Chevis Pretty, and  Shona Simpson). ?Objective ?Increasingly verbalize hopeful and positive statements regarding self, others, and the future. ?Target Date: 2021-10-14 Frequency: Daily ?Progress: 0 Modality: individual ?Related Interventions ?2. Assign the client to write at least one positive affirmation statement daily regarding himself/herself and the future (or assign "Positive Self-Talk" in the Adult Psychotherapy Homework Planner by Stephannie Li). ?Objective ?Learn and implement conflict resolution skills to resolve interpersonal problems. ?Target Date: 2021-10-14 Frequency: Daily ?Progress: 0 Modality: individual ?Related Interventions ?3. Teach conflict resolution skills (e.g., empathy, active listening, "I messages," respectful communication, assertiveness without aggression, compromise); use psychoeducation, modeling, role-playing, and rehearsal to work through several current conflicts; assign homework exercises; review and repeat so as to integrate their use into the client's life. ?Objective ?Learn and implement behavioral strategies to overcome depression. ?Target Date: 2021-10-14 Frequency: Daily ?Progress: 0 Modality: individual ?Related Interventions ?4. Engage the client in "behavioral activation," increasing his/her activity level and contact with sources of reward, while identifying processes that inhibit activation (see Behavioral Activation for Depression by Katharine Look, Dimidjian, and Herman-Dunn; or assign "Identify and Schedule Pleasant Activities" in the Adult Psychotherapy Homework Planner by [facility]); use behavioral techniques such as instruction, rehearsal, role-playing, role reversal, as needed, to facilitate activity in the client's daily life; reinforce success. ?Objective ?Identify and replace thoughts and beliefs that support depression. ?Target Date: 2021-10-14 Frequency: Daily ?Progress: 0 Modality: individual ?Related Interventions ?5. Conduct Cognitive-Behavioral Therapy (see Cognitive Behavior Therapy by Reola Calkins;  Overcoming Depression by Agapito Games al.), beginning with helping the client learn the connection among cognition, depressive feelings, and actions. ? ? ? ?Forde Radon, [facility] ? ? ? ? ? ? ? ? ? ? ? ? ? ? Forde Radon, [facility] ?

## 2021-10-12 ENCOUNTER — Ambulatory Visit (INDEPENDENT_AMBULATORY_CARE_PROVIDER_SITE_OTHER): Payer: BC Managed Care – PPO | Admitting: Psychology

## 2021-10-12 DIAGNOSIS — F331 Major depressive disorder, recurrent, moderate: Secondary | ICD-10-CM

## 2021-10-12 NOTE — Progress Notes (Signed)
? ? ? ?   Behavioral Health Counselor/Therapist Progress Note ? ?Patient ID: Wanda Hammond, MRN: [id number],   ? ?Date: 10/12/2021 ? ?Time Spent: 8:00am-8:50am  ? ?Treatment Type: Individual Therapy Pt is seen for virtual video visit via caregility.  Pt joins from her car outside of work and counselor from her home office.  ? ?Reported Symptoms: Pt reported feeling overwhelmed ?Appearance:  Well Groomed     ?Behavior: Appropriate  ?Motor: Normal  ?Speech/Language:  Normal Rate  ?Affect: Appropriate  ?Mood: normal  ?Thought process: normal  ?Thought content:   WNL  ?Sensory/Perceptual disturbances:   WNL  ?Orientation: oriented to person, place, time/date, and situation  ?Attention: Good  ?Concentration: Good  ?Memory: WNL  ?Fund of knowledge:  Good  ?Insight:   Good  ?Judgment:  Good  ?Impulse Control: Good  ? ?Risk Assessment: ?Danger to Self:  No ?Self-injurious Behavior: No ?Danger to Others: No ?Duty to Warn:no ?Physical Aggression / Violence:No  ?Access to Firearms a concern: No  ?Gang Involvement:No  ? ?Subjective: Counselor assessed pt current functioning per pt report.  Processed w/pt recent stressors and positives.  Explored w/ pt awareness for self care and time for recharging. Pt affect wnl.  Pt reported on stress of mom's surgery consultation and learning about her cancer.  Pt reported on positives for self w/ identifying need for self care time and that creating sundays for self and incorporating working out back in.  Pt was able to see benefit for self.  ? ?Interventions: Cognitive Behavioral Therapy, Insight-Oriented, and ACT ? ?Diagnosis:Major depressive disorder, recurrent episode, moderate (HCC) ? ?Plan: Pt to f/u 1 week for counseling to continue building coping skills for reducing depression.   Pt to f/u as scheduled w/ PCP for medication management.  ? ?Treatment Plan 10/14/20 ?Pt participated in development of plan and provided verbal consent ?Client Abilities/Strengths  ?supports:  Best friend, mom at times, husband trying to help out more positives: returning to school to complete degree seeking tx for self  ?Client Treatment Preferences  ?pt to f/u w/ counseling every 1-2 weeks pt to continue w/ PCP for medication management.  ?Client Statement of Needs  ?"internal joy. I don't want to dread the day. " have some motivation- not push things to the next day".  ?Treatment Level  ?outpatient counseling  ?Symptoms  ?Depressed or irritable mood.: No Description Entered (Status: maintained). Diminished interest in or enjoyment of activities.: No Description Entered (Status: maintained). Feelings of hopelessness, worthlessness, or inappropriate guilt.: No Description Entered (Status: maintained). Lack of energy.: No Description Entered (Status: maintained). Low self-esteem.: No Description Entered (Status: maintained). Poor concentration and indecisiveness.: No Description Entered (Status: maintained). Sleeplessness or hypersomnia.: No Description Entered (Status: maintained).  ?Problems Addressed  ?Unipolar Depression  ?Goals ?1. Alleviate depressive symptoms and return to previous level of effective functioning. ?Objective ?Implement mindfulness techniques for relapse prevention. ?Target Date: 2021-10-14 Frequency: Daily ?Progress: 0 Modality: individual ?Related Interventions ?1. Use mindfulness meditation and cognitive therapy techniques to help the client learn to recognize and regulate the negative thought processes associated with depression and to change his/her relationship with these thoughts (see Mindfulness-Based Cognitive Therapy for Depression by Chevis Pretty, and Shona Simpson). ?Objective ?Increasingly verbalize hopeful and positive statements regarding self, others, and the future. ?Target Date: 2021-10-14 Frequency: Daily ?Progress: 0 Modality: individual ?Related Interventions ?2. Assign the client to write at least one positive affirmation statement daily regarding himself/herself  and the future (or assign "Positive Self-Talk" in the Adult Psychotherapy Homework  Planner by Stephannie Li). ?Objective ?Learn and implement conflict resolution skills to resolve interpersonal problems. ?Target Date: 2021-10-14 Frequency: Daily ?Progress: 0 Modality: individual ?Related Interventions ?3. Teach conflict resolution skills (e.g., empathy, active listening, "I messages," respectful communication, assertiveness without aggression, compromise); use psychoeducation, modeling, role-playing, and rehearsal to work through several current conflicts; assign homework exercises; review and repeat so as to integrate their use into the client's life. ?Objective ?Learn and implement behavioral strategies to overcome depression. ?Target Date: 2021-10-14 Frequency: Daily ?Progress: 0 Modality: individual ?Related Interventions ?4. Engage the client in "behavioral activation," increasing his/her activity level and contact with sources of reward, while identifying processes that inhibit activation (see Behavioral Activation for Depression by Katharine Look, Dimidjian, and Herman-Dunn; or assign "Identify and Schedule Pleasant Activities" in the Adult Psychotherapy Homework Planner by [facility]); use behavioral techniques such as instruction, rehearsal, role-playing, role reversal, as needed, to facilitate activity in the client's daily life; reinforce success. ?Objective ?Identify and replace thoughts and beliefs that support depression. ?Target Date: 2021-10-14 Frequency: Daily ?Progress: 0 Modality: individual ?Related Interventions ?5. Conduct Cognitive-Behavioral Therapy (see Cognitive Behavior Therapy by Reola Calkins; Overcoming Depression by Agapito Games al.), beginning with helping the client learn the connection among cognition, depressive feelings, and actions. ? ? ? Forde Radon, [facility] ?

## 2021-10-19 ENCOUNTER — Ambulatory Visit (INDEPENDENT_AMBULATORY_CARE_PROVIDER_SITE_OTHER): Payer: BC Managed Care – PPO | Admitting: Psychology

## 2021-10-19 DIAGNOSIS — F330 Major depressive disorder, recurrent, mild: Secondary | ICD-10-CM | POA: Diagnosis not present

## 2021-10-19 NOTE — Progress Notes (Signed)
? ? ? ? ? ? ? ? ? ? ? ? ? ? ?  Cherlynn Popiel, LCMHC ?

## 2021-10-19 NOTE — Progress Notes (Signed)
[place] Counselor Annual Adult Exam ? ?Name: Wanda Hammond ?Date: 10/19/2021 ?MRN: [id number] ?DOB: Dec 20, 1985 ?PCP: Flossie Buffy, NP ? ?Time spent: 8:20am-8:59am Pt is seen for a virtual video visit via caregility.  Pt joins from her car outside of work and counselor from her home office.  Pt reports privacy.   ? ?Guardian/Payee:  self   ? ?Reason for Visit /Presenting Problem: Pt has been working w/ this counselor for the past year attending weekly to biweekly counseling visits to assist coping w/ depression.  Pt was initially referred by her PCP.  Pt is receiving medication management from her PCP.  Pt reports counseling has been beneficial for and has seen decrease in depressive symptoms and decrease in severity.  Pt has had multiple life stressors and changes in the past year.  This includes job change Oct 2022, separation from her husband Jan 2023 and mom's dx of breast cancer 09/2021.  Pt reports stress of adjusting to separation financially, coparenting, and being only parent in the home.   ? ?Mental Status Exam: ?Appearance:   Well Groomed     ?Behavior:  Appropriate  ?Motor:  Normal  ?Speech/Language:   Normal Rate  ?Affect:  Appropriate  ?Mood:  normal  ?Thought process:  normal  ?Thought content:    WNL  ?Sensory/Perceptual disturbances:    WNL  ?Orientation:  oriented to person, place, time/date, and situation  ?Attention:  Good  ?Concentration:  Good  ?Memory:  WNL  ?Fund of knowledge:   Good  ?Insight:    Good  ?Judgment:   Good  ?Impulse Control:  Good  ? ? ?Reported Symptoms:  Pt endorses depressed/down/discouraged moods some days.  Pt reports fatigue and some days struggle w/ motivation.  Pt reports sleep improved.  Pt reports increased identifying what she values and important for her.  Pt reports increasing positive self worth.  Pt reports struggles w/ what brings her joy and makes her happy.  Pt reports improved assertiveness and expressing self- but still  struggles w/ "finding her voice" w/her mom and w/her ex.   ? ?Risk Assessment: ?Danger to Self:  No ?Self-injurious Behavior: No ?Danger to Others: No ?Duty to Warn:no ?Physical Aggression / Violence:No  ?Access to Firearms a concern: No  ?Gang Involvement:No  ?Patient / guardian was educated about steps to take if suicide or homicide risk level increases between visits: yes ?While future psychiatric events cannot be accurately predicted, the patient does not currently require acute inpatient psychiatric care and does not currently meet [facility] involuntary commitment criteria. ? ?Substance Abuse History: ?Current substance abuse: No    ? ?Past Psychiatric History:   ?Previous psychological history is significant for depression ?Outpatient Providers:current counselor for 1 year and medications prescribed by PCP. ?History of Psych Hospitalization: No  ?Psychological Testing:  none   ? ?Abuse History:  ?Victim of: No.,  none reported    ?Report needed: No. ?Victim of Neglect:No. ?Perpetrator of  n/a   ?Witness / Exposure to Domestic Violence: No   ?Protective Services Involvement: No  ?Witness to [employer] Violence:  No  ? ?Family History:  ?Family History  ?Problem Relation Age of Onset  ? Diabetes Maternal Grandmother   ? Down syndrome Brother   ? Diabetes Mother   ? Hypertension Mother   ? Arthritis Mother   ? Cancer Father   ?     lung  ? Alcohol abuse Father   ? Asthma Father   ? Hypertension Father   ?  Anesthesia problems Neg Hx   ? ? ?Living situation: the patient lives with her children.  Pt separated from her husband Jan 2023.  Pt remained in family home.  Pt shares custody of her son and daughter w/ her husband.  She has physically custody during the week and every other weekend.   ? ?Sexual Orientation: Bisexual ? ?Relationship Status: separated from husband of 10 years.   ?Name of spouse: Elberta Fortis works for [employer] ?If a parent, number of children / ages:9y/o son [place] and 5y/o daughter [place].   ? ?Support Systems: Bestfriend and her mom.   ? ?Financial Stress:  Yes  adjusting to one income and no further support from dad to keep healthy boundaries.  ? ?Income/Employment/Disability: Employment  Pt works for [employer], production as Scientific laboratory technician.  Pt previously works for [employer] for 7 years.  Pt made decision to leave that position as not happy w/ management position and demands.   ? ?Military Service: No  ? 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  Pt didn't graduate as didn't pass final semester.  Pt never told anyone she didn't graduate.  Pt has planned to restart school in past year- but w/ multiple changes and stressors decided to no.   ? ?Religion/Sprituality/World View: ?Not reported ? ?Any cultural differences that may affect / interfere with treatment:  n/a ? ?Recreation/Hobbies: reading.  Time w/ friend.  Watching son play basketball.   ? ?Stressors: Other: changes in past year.  Separation, job change, mom's cancer dx.    ? ?Strengths: Supportive Relationships and Self Advocate ? ?Barriers:  financial stressors, single parent  ? ?Legal History: ?Pending legal issue / charges: The patient has no significant history of legal issues. ?History of legal issue / charges:  none ? ?Medical History/Surgical History: reviewed ?Past Medical History:  ?Diagnosis Date  ? Chicken pox   ? H/O varicella   ? Laceration of vaginal wall or sulcus without perineal laceration during delivery 02/19/2012  ? 2nd degree vaginal laceration; just left of pt's ML.  Repaired w/ 3 interrupted stitches  ? No pertinent past medical history   ? NSVD (normal spontaneous vaginal delivery) 02/19/2012  ? UTI (urinary tract infection)   ? ? ?No past surgical history on file. ? ?Medications: ?Current Outpatient Medications  ?Medication Sig Dispense Refill  ? albuterol (PROVENTIL HFA;VENTOLIN HFA) 108 (90 Base) MCG/ACT inhaler  Inhale 1-2 puffs into the lungs every 6 (six) hours as needed. 1 Inhaler 0  ? FLUoxetine (PROZAC) 20 MG tablet Take 1 tablet (20 mg total) by mouth daily. 90 tablet 3  ? levonorgestrel (MIRENA) 20 MCG/24HR IUD Mirena 20 mcg/24 hours (5 yrs) 52 mg intrauterine device ? Take 1 insert by intrauterine route.    ? metroNIDAZOLE (METROGEL VAGINAL) 0.75 % vaginal gel Place 1 Applicatorful vaginally at bedtime. x5nights 70 g 0  ? vitamin B-12 (CYANOCOBALAMIN) 1000 MCG tablet Take 1 tablet (1,000 mcg total) by mouth daily. 90 tablet 1  ? Vitamin D, Ergocalciferol, (DRISDOL) 1.25 MG (50000 UNIT) CAPS capsule Take 1 capsule (50,000 Units total) by mouth every 7 (seven) days. 12 capsule 0  ? ?No current facility-administered medications for this visit.  ? ? ?Allergies  ?Allergen Reactions  ? Latex Itching and Rash  ? ? ?Diagnoses:  ?MDD (major depressive disorder), recurrent episode, mild ([place]) ? ?Plan of Care:  Pt is a 35y/o female who has been attending  counseling consistently for past year for depression.  Pt also taking medication for depression as prescribed by PCP.  Pt endorses progress w/ depression lessening in severity.  Pt has had several changes in past year- deciding to leave job and change career, deciding to leave marriage and separating from husband and recently mom dx w/ breast cancer.  Pt is seeking continued support for coping w/ depression and stressors.  Pt to f/u w/ weekly counseling and continue medication management w/ her PCP.  ? ?Treatment Plan 10/19/21 ?Pt participated in development of plan and provided verbal consent ?Client Abilities/Strengths  ?supports: Best friend and mom.   ?Enjoys: reading ? ?Client Treatment Preferences  ?pt to continue w/ counseling every 1-2 weeks. pt to continue w/ PCP for medication management.  ?Client Statement of Needs  ?"I have seen improvements and my depression isn't as bad as used to be.  I want to explore what makes me happy and gives me joy.  I want to continue to  find my voice.  I want to find my determination to meet my health goals".    ?Treatment Level  ?outpatient counseling  ?Symptoms  ?Depressed or irritable mood. ?Difficulty w/ motivation. ?Lack of energy. ?Low self-est

## 2021-10-26 ENCOUNTER — Ambulatory Visit: Payer: BC Managed Care – PPO | Admitting: Psychology

## 2021-11-02 ENCOUNTER — Ambulatory Visit: Payer: BC Managed Care – PPO | Admitting: Psychology

## 2021-11-09 ENCOUNTER — Ambulatory Visit (INDEPENDENT_AMBULATORY_CARE_PROVIDER_SITE_OTHER): Payer: BC Managed Care – PPO | Admitting: Psychology

## 2021-11-09 DIAGNOSIS — F330 Major depressive disorder, recurrent, mild: Secondary | ICD-10-CM | POA: Diagnosis not present

## 2021-11-09 NOTE — Progress Notes (Signed)
Behavioral Health Counselor/Therapist Progress Note  Patient ID: Wanda Hammond, MRN: [id number],    Date: 11/09/2021  Time Spent: 8:00am-8:50am   Treatment Type: Individual Therapy Pt is seen for virtual video visit via caregility.  Pt joins from her car outside of work and counselor from her home office.   Reported Symptoms: Pt reported feeling blah mood and struggling w/ focus and motivation Appearance:  Well Groomed     Behavior: Appropriate  Motor: Normal  Speech/Language:  Normal Rate  Affect: Congruent  Mood: dysthymic  Thought process: normal  Thought content:   WNL  Sensory/Perceptual disturbances:   WNL  Orientation: oriented to person, place, time/date, and situation  Attention: Good  Concentration: Good  Memory: WNL  Fund of knowledge:  Good  Insight:   Good  Judgment:  Good  Impulse Control: Good   Risk Assessment: Danger to Self:  No Self-injurious Behavior: No Danger to Others: No Duty to Warn:no Physical Aggression / Violence:No  Access to Firearms a concern: No  Gang Involvement:No   Subjective: Counselor assessed pt current functioning per pt report.  Processed w/pt recent blah feeling and struggle w/ motivation. Discussed practice of self compassion and reframing negative self talk.  Explored w/pt ways to be intentional of acknowledging gratitude and opportunities for positive in the day.   Pt affect congruent w/ report of feeling blah.  Pt reported that feels that has made a lot of changes to reduce stress in her life but struggles to feel focused to maintain personal goals and struggles to stay motivated past a week.  Pt acknowledges how lead to negative self talk and feeling that not meeting expectations.  Pt receptive to self compassion and practicing for self daily.  Pt discussed how exploring focus struggles and relates to some other experiences w/ ADHD.   Interventions: Cognitive Behavioral Therapy, Insight-Oriented, and  ACT  Diagnosis:MDD (major depressive disorder), recurrent episode, mild (HCC)  Plan: Pt to f/u 1 week for counseling to continue building coping skills for reducing depression.   Pt to f/u as scheduled w/ PCP for medication management.   Treatment Plan 10/19/21 Pt participated in development of plan and provided verbal consent Client Abilities/Strengths  supports: Best friend and mom.   Enjoys: reading   Client Treatment Preferences  pt to continue w/ counseling every 1-2 weeks. pt to continue w/ PCP for medication management.  Client Statement of Needs  "I have seen improvements and my depression isn't as bad as used to be.  I want to explore what makes me happy and gives me joy.  I want to continue to find my voice.  I want to find my determination to meet my health goals".    Treatment Level  outpatient counseling  Symptoms  Depressed or irritable mood. Difficulty w/ motivation. Lack of energy. Low self-esteem- asserting self   Problems Addressed  Unipolar Depression  Goals 1. Alleviate depressive symptoms and return to previous level of effective functioning.   Objective Implement mindfulness techniques for relapse prevention. Target Date: 2022-10-20             Frequency: Daily Progress: 50         Modality: individual Related Interventions           Use mindfulness meditation and cognitive therapy techniques to help the client learn to recognize and regulate the negative thought processes associated with depression and to change his/her relationship with these thoughts (see Mindfulness-Based Cognitive Therapy for  Depression by Chevis Pretty, and Shona Simpson).   Objective Increasingly verbalize hopeful and positive statements regarding self, others, and the future. Target Date: 2022-10-20             Frequency: Daily Progress: 70         Modality: individual Related Interventions           . Objective Learn and implement conflict resolution skills to resolve interpersonal  problems. Target Date: 2022-10-20             Frequency: Daily Progress: 50         Modality: individual Related Interventions 3.           Teach conflict resolution skills (e.g., empathy, active listening, "I messages," respectful communication, assertiveness without aggression, compromise); use psychoeducation, modeling, role-playing, and rehearsal to work through several current conflicts; assign homework exercises; review and repeat so as to integrate their use into the client's life. Objective   Learn and implement behavioral strategies to overcome depression. Target Date: 2022-10-20             Frequency: Daily Progress: 50         Modality: individual Related Interventions            Engage the client in "behavioral activation," increasing his/her activity level and contact with sources of reward, while identifying processes that inhibit activation (see Behavioral Activation for Depression by Katharine Look, Dimidjian, and Herman-Dunn; or assign "Identify and Schedule Pleasant Activities" in the Adult Psychotherapy Homework Planner by [facility]); use behavioral techniques such as instruction, rehearsal, role-playing, role reversal, as needed, to facilitate activity in the client's daily life; reinforce success.   Objective Identify and replace thoughts and beliefs that support depression. Target Date: 2022-10-20             Frequency: Daily Progress: 70         Modality: individual Related Interventions 5.           Conduct Cognitive-Behavioral Therapy (see Cognitive Behavior Therapy by Reola Calkins; Overcoming Depression by Agapito Games al.), beginning with helping the client learn the connection among cognition, depressive feelings, and actions.  Pt participated in development of plan and provided verbal consent.     Forde Radon, [facility]

## 2021-11-16 ENCOUNTER — Ambulatory Visit (INDEPENDENT_AMBULATORY_CARE_PROVIDER_SITE_OTHER): Payer: BC Managed Care – PPO | Admitting: Psychology

## 2021-11-16 DIAGNOSIS — F330 Major depressive disorder, recurrent, mild: Secondary | ICD-10-CM

## 2021-11-16 NOTE — Progress Notes (Signed)
[place] Counselor/Therapist Progress Note  Patient ID: Wanda Hammond, MRN: [id number],    Date: 11/16/2021  Time Spent: 8:00am-8:50am   Treatment Type: Individual Therapy Pt is seen for virtual video visit via caregility.  Pt joins from her car outside of work and counselor from her home office.   Reported Symptoms: Pt reported some improved mood, practice daily gratitude, still struggling w/ focus and motivation Appearance:  Well Groomed     Behavior: Appropriate  Motor: Normal  Speech/Language:  Normal Rate  Affect: Appropriate  Mood: normal  Thought process: normal  Thought content:   WNL  Sensory/Perceptual disturbances:   WNL  Orientation: oriented to person, place, time/date, and situation  Attention: Good  Concentration: Good  Memory: WNL  Fund of knowledge:  Good  Insight:   Good  Judgment:  Good  Impulse Control: Good   Risk Assessment: Danger to Self:  No Self-injurious Behavior: No Danger to Others: No Duty to Warn:no Physical Aggression / Violence:No  Access to Firearms a concern: No  Gang Involvement:No   Subjective: Counselor assessed pt current functioning per pt report.  Processed w/pt mood, motivation, positives and stressors.  Reflected positive of putting in practice intention for a positive in day and gratitude. Explored impact had for mood.  Continued to discuss practice of mindfulness an self compassion.  Pt affect wnl.  Pt reported on stressors and positives in this past week.  Pt reports she has been intentional about acknowledging or creating opportunities for a positive in her day.  Pt reported that she also focused on couple small actions to be consistent w/ space clean and felt good to do this.  Pt reported awareness of focus on one task to completion difficult.  Pt discussed mindful practices she can incorporate.  Pt looking forward to some increased down time this next week.    Interventions: Cognitive Behavioral  Therapy, Insight-Oriented, and ACT  Diagnosis:MDD (major depressive disorder), recurrent episode, mild ([facility])  Plan: Pt to f/u 2 weeks for counseling to continue building coping skills for reducing depression.   Pt to f/u as scheduled w/ PCP for medication management.   Treatment Plan 10/19/21 Pt participated in development of plan and provided verbal consent Client Abilities/Strengths  supports: Best friend and mom.   Enjoys: reading   Client Treatment Preferences  pt to continue w/ counseling every 1-2 weeks. pt to continue w/ PCP for medication management.  Client Statement of Needs  "I have seen improvements and my depression isn't as bad as used to be.  I want to explore what makes me happy and gives me joy.  I want to continue to find my voice.  I want to find my determination to meet my health goals".    Treatment Level  outpatient counseling  Symptoms  Depressed or irritable mood. Difficulty w/ motivation. Lack of energy. Low self-esteem- asserting self   Problems Addressed  Unipolar Depression  Goals 1. Alleviate depressive symptoms and return to previous level of effective functioning.   Objective Implement mindfulness techniques for relapse prevention. Target Date: 2022-10-20             Frequency: Daily Progress: 50         Modality: individual Related Interventions           Use mindfulness meditation and cognitive therapy techniques to help the client learn to recognize and regulate the negative thought processes associated with depression and to change his/her relationship with  these thoughts (see Mindfulness-Based Cognitive Therapy for Depression by Walden Field, and Clyde Canterbury).   Objective Increasingly verbalize hopeful and positive statements regarding self, others, and the future. Target Date: 2022-10-20             Frequency: Daily Progress: 70         Modality: individual Related Interventions           . Objective Learn and implement conflict resolution  skills to resolve interpersonal problems. Target Date: 2022-10-20             Frequency: Daily Progress: 50         Modality: individual Related Interventions 3.           Teach conflict resolution skills (e.g., empathy, active listening, "I messages," respectful communication, assertiveness without aggression, compromise); use psychoeducation, modeling, role-playing, and rehearsal to work through several current conflicts; assign homework exercises; review and repeat so as to integrate their use into the client's life. Objective   Learn and implement behavioral strategies to overcome depression. Target Date: 2022-10-20             Frequency: Daily Progress: 50         Modality: individual Related Interventions            Engage the client in "behavioral activation," increasing his/her activity level and contact with sources of reward, while identifying processes that inhibit activation (see Behavioral Activation for Depression by Beverly Gust, Dimidjian, and Herman-Dunn; or assign "Identify and Schedule Pleasant Activities" in the Adult Psychotherapy Homework Planner by [facility]); use behavioral techniques such as instruction, rehearsal, role-playing, role reversal, as needed, to facilitate activity in the client's daily life; reinforce success.   Objective Identify and replace thoughts and beliefs that support depression. Target Date: 2022-10-20             Frequency: Daily Progress: 70         Modality: individual Related Interventions 5.           Conduct Cognitive-Behavioral Therapy (see Cognitive Behavior Therapy by Olevia Bowens; Overcoming Depression by Lynita Lombard al.), beginning with helping the client learn the connection among cognition, depressive feelings, and actions.  Pt participated in development of plan and provided verbal consent.     Jan Fireman, [facility]

## 2021-11-29 ENCOUNTER — Ambulatory Visit (INDEPENDENT_AMBULATORY_CARE_PROVIDER_SITE_OTHER): Payer: BC Managed Care – PPO | Admitting: Psychology

## 2021-11-29 DIAGNOSIS — F330 Major depressive disorder, recurrent, mild: Secondary | ICD-10-CM | POA: Diagnosis not present

## 2021-11-29 NOTE — Progress Notes (Signed)
[facility] Counselor/Therapist Progress Note  Patient ID: Wanda Hammond, MRN: [id number],    Date: 11/29/2021  Time Spent: 10:00am-10:52am   Treatment Type: Individual Therapy Pt is seen for virtual video visit via caregility.  Pt joins from her home and counselor from her home office.   Reported Symptoms: Pt reported mood good,  still struggling w/ focus and motivation  Appearance:  Well Groomed     Behavior: Appropriate  Motor: Normal  Speech/Language:  Normal Rate  Affect: Appropriate  Mood: normal  Thought process: normal  Thought content:   WNL  Sensory/Perceptual disturbances:   WNL  Orientation: oriented to person, place, time/date, and situation  Attention: Good  Concentration: Good  Memory: WNL  Fund of knowledge:  Good  Insight:   Good  Judgment:  Good  Impulse Control: Good   Risk Assessment: Danger to Self:  No Self-injurious Behavior: No Danger to Others: No Duty to Warn:no Physical Aggression / Violence:No  Access to Firearms a concern: No  Gang Involvement:No   Subjective: Counselor assessed pt current functioning per pt report.  Processed w/pt interactions w/ friend.  Reflected insight into how approach things differently and how to respond to friend in future.  Explored w/pt self talk and assisted in reframing negative self talk and recognizing positives for herself. Pt affect wnl.  Pt reported on week w/out the kids and how wasn't emotional as anticipated.  Pt reported that friend joined to hang out and then took over w/ plan to clean her house.  Pt discussed how although good w/ outcome didn't enjoy process and would want to do her own way next time.  Pt reflected on how to set boundaries in future.  Pt identified that better mom than give credit for and make positive self statement about.   Interventions: Cognitive Behavioral Therapy, Insight-Oriented, and ACT  Diagnosis:MDD (major depressive disorder), recurrent episode,  mild (HCC)  Plan: Pt to f/u 2 weeks for counseling to continue building coping skills for reducing depression.   Pt to f/u as scheduled w/ PCP for medication management.   Treatment Plan 10/19/21 Pt participated in development of plan and provided verbal consent Client Abilities/Strengths  supports: Best friend and mom.   Enjoys: reading   Client Treatment Preferences  pt to continue w/ counseling every 1-2 weeks. pt to continue w/ PCP for medication management.  Client Statement of Needs  "I have seen improvements and my depression isn't as bad as used to be.  I want to explore what makes me happy and gives me joy.  I want to continue to find my voice.  I want to find my determination to meet my health goals".    Treatment Level  outpatient counseling  Symptoms  Depressed or irritable mood. Difficulty w/ motivation. Lack of energy. Low self-esteem- asserting self   Problems Addressed  Unipolar Depression  Goals 1. Alleviate depressive symptoms and return to previous level of effective functioning.   Objective Implement mindfulness techniques for relapse prevention. Target Date: 2022-10-20             Frequency: Daily Progress: 50         Modality: individual Related Interventions           Use mindfulness meditation and cognitive therapy techniques to help the client learn to recognize and regulate the negative thought processes associated with depression and to change his/her relationship with these thoughts (see Mindfulness-Based Cognitive Therapy for Depression by Dairl Ponder,  Mayford Knife and [place]).   Objective Increasingly verbalize hopeful and positive statements regarding self, others, and the future. Target Date: 2022-10-20             Frequency: Daily Progress: 70         Modality: individual Related Interventions           . Objective Learn and implement conflict resolution skills to resolve interpersonal problems. Target Date: 2022-10-20             Frequency:  Daily Progress: 50         Modality: individual Related Interventions 3.           Teach conflict resolution skills (e.g., empathy, active listening, "I messages," respectful communication, assertiveness without aggression, compromise); use psychoeducation, modeling, role-playing, and rehearsal to work through several current conflicts; assign homework exercises; review and repeat so as to integrate their use into the client's life. Objective   Learn and implement behavioral strategies to overcome depression. Target Date: 2022-10-20             Frequency: Daily Progress: 50         Modality: individual Related Interventions            Engage the client in "behavioral activation," increasing his/her activity level and contact with sources of reward, while identifying processes that inhibit activation (see Behavioral Activation for Depression by Katharine Look, Dimidjian, and Herman-Dunn; or assign "Identify and Schedule Pleasant Activities" in the Adult Psychotherapy Homework Planner by [facility]); use behavioral techniques such as instruction, rehearsal, role-playing, role reversal, as needed, to facilitate activity in the client's daily life; reinforce success.   Objective Identify and replace thoughts and beliefs that support depression. Target Date: 2022-10-20             Frequency: Daily Progress: 70         Modality: individual Related Interventions 5.           Conduct Cognitive-Behavioral Therapy (see Cognitive Behavior Therapy by Reola Calkins; Overcoming Depression by Agapito Games al.), beginning with helping the client learn the connection among cognition, depressive feelings, and actions.  Pt participated in development of plan and provided verbal consent.     Forde Radon, [facility]

## 2021-12-03 ENCOUNTER — Ambulatory Visit: Payer: BC Managed Care – PPO | Admitting: Family Medicine

## 2021-12-03 ENCOUNTER — Encounter: Payer: Self-pay | Admitting: Family Medicine

## 2021-12-03 VITALS — BP 104/66 | HR 108 | Temp 98.4°F | Ht 64.0 in | Wt 212.6 lb

## 2021-12-03 DIAGNOSIS — N390 Urinary tract infection, site not specified: Secondary | ICD-10-CM | POA: Diagnosis not present

## 2021-12-03 DIAGNOSIS — R509 Fever, unspecified: Secondary | ICD-10-CM | POA: Insufficient documentation

## 2021-12-03 DIAGNOSIS — R319 Hematuria, unspecified: Secondary | ICD-10-CM | POA: Diagnosis not present

## 2021-12-03 LAB — COMPREHENSIVE METABOLIC PANEL
ALT: 8 U/L (ref 0–35)
AST: 12 U/L (ref 0–37)
Albumin: 3.9 g/dL (ref 3.5–5.2)
Alkaline Phosphatase: 55 U/L (ref 39–117)
BUN: 9 mg/dL (ref 6–23)
CO2: 28 mEq/L (ref 19–32)
Calcium: 9.1 mg/dL (ref 8.4–10.5)
Chloride: 100 mEq/L (ref 96–112)
Creatinine, Ser: 1.44 mg/dL — ABNORMAL HIGH (ref 0.40–1.20)
GFR: 46.71 mL/min — ABNORMAL LOW (ref >60.00)
Glucose, Bld: 116 mg/dL — ABNORMAL HIGH (ref 70–99)
Potassium: 3.9 mEq/L (ref 3.5–5.1)
Sodium: 137 mEq/L (ref 135–145)
Total Bilirubin: 0.7 mg/dL (ref 0.2–1.2)
Total Protein: 7.9 g/dL (ref 6.0–8.3)

## 2021-12-03 LAB — CBC WITH DIFFERENTIAL/PLATELET
Basophils Absolute: 0 10*3/uL (ref 0.0–0.1)
Basophils Relative: 0.2 % (ref 0.0–3.0)
Eosinophils Absolute: 0 10*3/uL (ref 0.0–0.7)
Eosinophils Relative: 0 % (ref 0.0–5.0)
HCT: 38.6 % (ref 36.0–46.0)
Hemoglobin: 12.7 g/dL (ref 12.0–15.0)
Lymphocytes Relative: 8.7 % — ABNORMAL LOW (ref 12.0–46.0)
Lymphs Abs: 1.5 10*3/uL (ref 0.7–4.0)
MCHC: 32.8 g/dL (ref 30.0–36.0)
MCV: 91.4 fl (ref 78.0–100.0)
Monocytes Absolute: 2.5 10*3/uL — ABNORMAL HIGH (ref 0.1–1.0)
Monocytes Relative: 14.9 % — ABNORMAL HIGH (ref 3.0–12.0)
Neutro Abs: 13 10*3/uL — ABNORMAL HIGH (ref 1.4–7.7)
Neutrophils Relative %: 76.2 % (ref 43.0–77.0)
Platelets: 256 10*3/uL (ref 150.0–400.0)
RBC: 4.23 Mil/uL (ref 3.87–5.11)
RDW: 13.5 % (ref 11.5–15.5)
WBC: 17 10*3/uL — ABNORMAL HIGH (ref 4.0–10.5)

## 2021-12-03 LAB — URINALYSIS, ROUTINE W REFLEX MICROSCOPIC
Bilirubin Urine: NEGATIVE
Ketones, ur: NEGATIVE
Nitrite: NEGATIVE
Specific Gravity, Urine: 1.01 (ref 1.000–1.030)
Total Protein, Urine: 100 — AB
Urine Glucose: NEGATIVE
Urobilinogen, UA: >=8 — AB (ref 0.0–1.0)
pH: 7.5 (ref 5.0–8.0)

## 2021-12-05 LAB — URINE CULTURE
MICRO NUMBER:: 13564942
SPECIMEN QUALITY:: ADEQUATE

## 2021-12-06 DIAGNOSIS — N390 Urinary tract infection, site not specified: Secondary | ICD-10-CM | POA: Insufficient documentation

## 2021-12-06 MED ORDER — SULFAMETHOXAZOLE-TRIMETHOPRIM 800-160 MG PO TABS: 1.0000 | ORAL_TABLET | Freq: Two times a day (BID) | ORAL | 0 refills | Status: AC

## 2021-12-07 ENCOUNTER — Ambulatory Visit (INDEPENDENT_AMBULATORY_CARE_PROVIDER_SITE_OTHER): Payer: BC Managed Care – PPO | Admitting: Psychology

## 2021-12-07 DIAGNOSIS — F330 Major depressive disorder, recurrent, mild: Secondary | ICD-10-CM

## 2021-12-08 ENCOUNTER — Ambulatory Visit: Payer: BC Managed Care – PPO | Admitting: Nurse Practitioner

## 2021-12-08 ENCOUNTER — Telehealth: Payer: Self-pay | Admitting: Nurse Practitioner

## 2021-12-08 NOTE — Telephone Encounter (Signed)
6.28.23 no show letter sent 

## 2021-12-15 ENCOUNTER — Encounter: Payer: BC Managed Care – PPO | Admitting: Nurse Practitioner

## 2021-12-21 ENCOUNTER — Ambulatory Visit (INDEPENDENT_AMBULATORY_CARE_PROVIDER_SITE_OTHER): Payer: BC Managed Care – PPO | Admitting: Psychology

## 2021-12-21 DIAGNOSIS — F330 Major depressive disorder, recurrent, mild: Secondary | ICD-10-CM | POA: Diagnosis not present

## 2021-12-21 NOTE — Progress Notes (Signed)
[facility] Counselor/Therapist Progress Note  Patient ID: Wanda Hammond, MRN: [id number],    Date: 12/21/2021  Time Spent: 8:00am-8:57am   Treatment Type: Individual Therapy Pt is seen for virtual video visit via caregility.  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   Reported Symptoms: Pt reported fatigue, struggle w/ motivation  Appearance:  Well Groomed     Behavior: Appropriate  Motor: Normal  Speech/Language:  Normal Rate  Affect: Appropriate  Mood: normal  Thought process: normal  Thought content:   WNL  Sensory/Perceptual disturbances:   WNL  Orientation: oriented to person, place, time/date, and situation  Attention: Good  Concentration: Good  Memory: WNL  Fund of knowledge:  Good  Insight:   Good  Judgment:  Good  Impulse Control: Good   Risk Assessment: Danger to Self:  No Self-injurious Behavior: No Danger to Others: No Duty to Warn:no Physical Aggression / Violence:No  Access to Firearms a concern: No  Gang Involvement:No   Subjective: Counselor assessed pt current functioning per pt report.  Processed w/pt stressors, thought patterns and motivation/fatigue.  Assisted pt w/ identifying distortions and how impacted.  Discussed ways to focus on present, not what ifs and reframe.  Encouraged to f/u w/ medication management and change w/out being on medication.  Pt affect wnl.  Pt reported she was busy w/ travel basketball over the weekend.  Pt reports tired and struggle w/ motivation to keep up w/ household they way she would like.  Pt acknowledged that w/ schedule that might have to change expectation.  Pt reported on stressors of finances recent and worries about promotion not being available in near future.  Pt discussed how spiraled w/ what ifs over weekend w/ this.  Pt acknowledged reframes and focus on present to assist coping.  Pt also discussed how she has stopped taking Prozac for awhile and recognized more  struggles since.  Pt will f/u w/ her PCP at well check tomorrow about restarting.     Interventions: Cognitive Behavioral Therapy, Insight-Oriented, and ACT  Diagnosis:MDD (major depressive disorder), recurrent episode, mild (HCC)  Plan: Pt to f/u 1 week for counseling to continue building coping skills for reducing depression.   Pt to f/u as scheduled w/ PCP for medication management.   Treatment Plan 10/19/21 Pt participated in development of plan and provided verbal consent Client Abilities/Strengths  supports: Best friend and mom.   Enjoys: reading   Client Treatment Preferences  pt to continue w/ counseling every 1-2 weeks. pt to continue w/ PCP for medication management.  Client Statement of Needs  "I have seen improvements and my depression isn't as bad as used to be.  I want to explore what makes me happy and gives me joy.  I want to continue to find my voice.  I want to find my determination to meet my health goals".    Treatment Level  outpatient counseling  Symptoms  Depressed or irritable mood. Difficulty w/ motivation. Lack of energy. Low self-esteem- asserting self   Problems Addressed  Unipolar Depression  Goals 1. Alleviate depressive symptoms and return to previous level of effective functioning.   Objective Implement mindfulness techniques for relapse prevention. Target Date: 2022-10-20             Frequency: Daily Progress: 50         Modality: individual Related Interventions           Use mindfulness meditation and cognitive therapy  techniques to help the client learn to recognize and regulate the negative thought processes associated with depression and to change his/her relationship with these thoughts (see Mindfulness-Based Cognitive Therapy for Depression by Chevis Pretty, and Shona Simpson).   Objective Increasingly verbalize hopeful and positive statements regarding self, others, and the future. Target Date: 2022-10-20             Frequency:  Daily Progress: 70         Modality: individual Related Interventions           . Objective Learn and implement conflict resolution skills to resolve interpersonal problems. Target Date: 2022-10-20             Frequency: Daily Progress: 50         Modality: individual Related Interventions 3.           Teach conflict resolution skills (e.g., empathy, active listening, "I messages," respectful communication, assertiveness without aggression, compromise); use psychoeducation, modeling, role-playing, and rehearsal to work through several current conflicts; assign homework exercises; review and repeat so as to integrate their use into the client's life. Objective   Learn and implement behavioral strategies to overcome depression. Target Date: 2022-10-20             Frequency: Daily Progress: 50         Modality: individual Related Interventions            Engage the client in "behavioral activation," increasing his/her activity level and contact with sources of reward, while identifying processes that inhibit activation (see Behavioral Activation for Depression by Katharine Look, Dimidjian, and Herman-Dunn; or assign "Identify and Schedule Pleasant Activities" in the Adult Psychotherapy Homework Planner by [facility]); use behavioral techniques such as instruction, rehearsal, role-playing, role reversal, as needed, to facilitate activity in the client's daily life; reinforce success.   Objective Identify and replace thoughts and beliefs that support depression. Target Date: 2022-10-20             Frequency: Daily Progress: 70         Modality: individual Related Interventions 5.           Conduct Cognitive-Behavioral Therapy (see Cognitive Behavior Therapy by Reola Calkins; Overcoming Depression by Agapito Games al.), beginning with helping the client learn the connection among cognition, depressive feelings, and actions.  Pt participated in development of plan and provided verbal  consent.        Forde Radon, [facility]

## 2021-12-22 ENCOUNTER — Ambulatory Visit (INDEPENDENT_AMBULATORY_CARE_PROVIDER_SITE_OTHER): Payer: BC Managed Care – PPO | Admitting: Nurse Practitioner

## 2021-12-22 ENCOUNTER — Encounter: Payer: Self-pay | Admitting: Nurse Practitioner

## 2021-12-22 VITALS — BP 116/76 | HR 46 | Temp 97.3°F | Ht 64.0 in | Wt 209.2 lb

## 2021-12-22 DIAGNOSIS — Z975 Presence of (intrauterine) contraceptive device: Secondary | ICD-10-CM | POA: Insufficient documentation

## 2021-12-22 DIAGNOSIS — Z6835 Body mass index (BMI) 35.0-35.9, adult: Secondary | ICD-10-CM

## 2021-12-22 DIAGNOSIS — E538 Deficiency of other specified B group vitamins: Secondary | ICD-10-CM

## 2021-12-22 DIAGNOSIS — D72829 Elevated white blood cell count, unspecified: Secondary | ICD-10-CM | POA: Diagnosis not present

## 2021-12-22 DIAGNOSIS — F332 Major depressive disorder, recurrent severe without psychotic features: Secondary | ICD-10-CM

## 2021-12-22 DIAGNOSIS — E559 Vitamin D deficiency, unspecified: Secondary | ICD-10-CM | POA: Diagnosis not present

## 2021-12-22 LAB — CBC WITH DIFFERENTIAL/PLATELET
Basophils Absolute: 0 10*3/uL (ref 0.0–0.1)
Basophils Relative: 0.1 % (ref 0.0–3.0)
Eosinophils Absolute: 0.1 10*3/uL (ref 0.0–0.7)
Eosinophils Relative: 1 % (ref 0.0–5.0)
HCT: 37.7 % (ref 36.0–46.0)
Hemoglobin: 12.4 g/dL (ref 12.0–15.0)
Lymphocytes Relative: 22.1 % (ref 12.0–46.0)
Lymphs Abs: 1.8 10*3/uL (ref 0.7–4.0)
MCHC: 33 g/dL (ref 30.0–36.0)
MCV: 90.5 fl (ref 78.0–100.0)
Monocytes Absolute: 0.6 10*3/uL (ref 0.1–1.0)
Monocytes Relative: 7.9 % (ref 3.0–12.0)
Neutro Abs: 5.5 10*3/uL (ref 1.4–7.7)
Neutrophils Relative %: 68.9 % (ref 43.0–77.0)
Platelets: 280 10*3/uL (ref 150.0–400.0)
RBC: 4.16 Mil/uL (ref 3.87–5.11)
RDW: 13.7 % (ref 11.5–15.5)
WBC: 8 10*3/uL (ref 4.0–10.5)

## 2021-12-22 LAB — BASIC METABOLIC PANEL
BUN: 15 mg/dL (ref 6–23)
CO2: 27 mEq/L (ref 19–32)
Calcium: 9.3 mg/dL (ref 8.4–10.5)
Chloride: 104 mEq/L (ref 96–112)
Creatinine, Ser: 1.21 mg/dL — ABNORMAL HIGH (ref 0.40–1.20)
GFR: 57.53 mL/min — ABNORMAL LOW (ref >60.00)
Glucose, Bld: 87 mg/dL (ref 70–99)
Potassium: 4.4 mEq/L (ref 3.5–5.1)
Sodium: 136 mEq/L (ref 135–145)

## 2021-12-22 LAB — TSH: TSH: 3.05 u[IU]/mL (ref 0.35–5.50)

## 2021-12-22 LAB — VITAMIN B12: Vitamin B-12: 218 pg/mL (ref 211–911)

## 2021-12-22 LAB — VITAMIN D 25 HYDROXY (VIT D DEFICIENCY, FRACTURES): VITD: 19.3 ng/mL — ABNORMAL LOW (ref 30.00–100.00)

## 2021-12-22 MED ORDER — BUPROPION HCL 75 MG PO TABS: 75.0000 mg | ORAL_TABLET | Freq: Two times a day (BID) | ORAL | 2 refills | Status: DC

## 2021-12-22 MED ORDER — VITAMIN D (ERGOCALCIFEROL) 1.25 MG (50000 UNIT) PO CAPS: 50000.0000 [IU] | ORAL_CAPSULE | ORAL | 0 refills | Status: AC

## 2021-12-22 MED ORDER — VITAMIN B-12 1000 MCG PO TABS: 1000.0000 ug | ORAL_TABLET | Freq: Every day | ORAL | 1 refills | Status: AC

## 2021-12-22 NOTE — Assessment & Plan Note (Signed)
Possibly due to acute cystitis Resolved fever and urinary symptoms after completion of oral abx CBC    Component Value Date/Time   WBC 17.0 (H) 12/03/2021 1047   RBC 4.23 12/03/2021 1047   HGB 12.7 12/03/2021 1047   HCT 38.6 12/03/2021 1047   PLT 256.0 12/03/2021 1047   MCV 91.4 12/03/2021 1047   MCH 28.2 07/11/2016 0534   MCHC 32.8 12/03/2021 1047   RDW 13.5 12/03/2021 1047   LYMPHSABS 1.5 12/03/2021 1047   MONOABS 2.5 (H) 12/03/2021 1047   EOSABS 0.0 12/03/2021 1047   BASOSABS 0.0 12/03/2021 1047   Repeat cbc today

## 2021-12-22 NOTE — Assessment & Plan Note (Signed)
Repeat vit. B12 ?

## 2021-12-22 NOTE — Progress Notes (Signed)
Established Patient Visit  Patient: Wanda Hammond   DOB: 05/06/1986   36 y.o. Female  MRN: [id number] Visit Date: 12/22/2021  Subjective:    Chief Complaint  Patient presents with   Follow-up    CPE Weight management and depression   HPI Vitamin D deficiency Persistent fatigue and muscle weakness Repeat vit. D  Depression, recurrent (HCC) D/c fluoxetine several months ago. Fluoxetine provided some improvement in mood Reports marital discord and work stress as cause of anxious and depressed mood, but emotions were blunted. She has since changed jobs and separated from her husband. Reports her mood did not improve due to financial stressor. She reports persistent fatigue, lack of motivations and loss of interest in things she typically enjoyed. Denies any SI/HI/hallucinations. She has therapy sessions every week.  Agreed to start wellbutrin 75mg  BID, continue sessions with therapist F/up in 39month  B12 deficiency Repeat vit. B12  Leukocytosis Possibly due to acute cystitis Resolved fever and urinary symptoms after completion of oral abx CBC    Component Value Date/Time   WBC 17.0 (H) 12/03/2021 1047   RBC 4.23 12/03/2021 1047   HGB 12.7 12/03/2021 1047   HCT 38.6 12/03/2021 1047   PLT 256.0 12/03/2021 1047   MCV 91.4 12/03/2021 1047   MCH 28.2 07/11/2016 0534   MCHC 32.8 12/03/2021 1047   RDW 13.5 12/03/2021 1047   LYMPHSABS 1.5 12/03/2021 1047   MONOABS 2.5 (H) 12/03/2021 1047   EOSABS 0.0 12/03/2021 1047   BASOSABS 0.0 12/03/2021 1047   Repeat cbc today  Class 2 severe obesity due to excess calories with serious comorbidity and body mass index (BMI) of 35.0 to 35.9 in adult [facility]) Has difficulty with maintain heart healthy diet and daily exercise. Reports she is aware of possible complications caused by obesity. Wt Readings from Last 3 Encounters:  12/22/21 209 lb 3.2 oz (94.9 kg)  12/03/21 212 lb 9.6 oz (96.4 kg)  04/29/21 214 lb (97.1  kg)   Provided information on how to increase daily exercise, make heart healthy meal choices and decrease portion size. F/up in 40month  Reviewed medical, surgical, and social history today  Medications: Outpatient Medications Prior to Visit  Medication Sig   levonorgestrel (MIRENA) 20 MCG/24HR IUD Mirena 20 mcg/24 hours (5 yrs) 52 mg intrauterine device  Take 1 insert by intrauterine route.   [DISCONTINUED] albuterol (PROVENTIL HFA;VENTOLIN HFA) 108 (90 Base) MCG/ACT inhaler Inhale 1-2 puffs into the lungs every 6 (six) hours as needed. (Patient not taking: Reported on 12/03/2021)   [DISCONTINUED] FLUoxetine (PROZAC) 20 MG tablet Take 1 tablet (20 mg total) by mouth daily. (Patient not taking: Reported on 12/22/2021)   [DISCONTINUED] metroNIDAZOLE (METROGEL VAGINAL) 0.75 % vaginal gel Place 1 Applicatorful vaginally at bedtime. x5nights (Patient not taking: Reported on 12/03/2021)   [DISCONTINUED] vitamin B-12 (CYANOCOBALAMIN) 1000 MCG tablet Take 1 tablet (1,000 mcg total) by mouth daily. (Patient not taking: Reported on 12/22/2021)   [DISCONTINUED] Vitamin D, Ergocalciferol, (DRISDOL) 1.25 MG (50000 UNIT) CAPS capsule Take 1 capsule (50,000 Units total) by mouth every 7 (seven) days. (Patient not taking: Reported on 12/22/2021)   No facility-administered medications prior to visit.   Reviewed past medical and social history.   ROS per HPI above      Objective:  BP 116/76 (BP Location: Right Arm, Patient Position: Sitting, Cuff Size: Normal)   Pulse (!) 46   Temp (!) 97.3 F (36.3  C) (Temporal)   Ht 5\' 4"  (1.626 m)   Wt 209 lb 3.2 oz (94.9 kg)   SpO2 (!) 87%   BMI 35.91 kg/m      Physical Exam  No results found for any visits on 12/22/21.    Assessment & Plan:    Problem List Items Addressed This Visit       Other   B12 deficiency    Repeat vit. B12      Relevant Medications   vitamin B-12 (CYANOCOBALAMIN) 1000 MCG tablet   Other Relevant Orders   B12   Class 2  severe obesity due to excess calories with serious comorbidity and body mass index (BMI) of 35.0 to 35.9 in adult [facility])    Has difficulty with maintain heart healthy diet and daily exercise. Reports she is aware of possible complications caused by obesity. Wt Readings from Last 3 Encounters:  12/22/21 209 lb 3.2 oz (94.9 kg)  12/03/21 212 lb 9.6 oz (96.4 kg)  04/29/21 214 lb (97.1 kg)   Provided information on how to increase daily exercise, make heart healthy meal choices and decrease portion size. F/up in 59month      Depression, recurrent (HCC) - Primary    D/c fluoxetine several months ago. Fluoxetine provided some improvement in mood Reports marital discord and work stress as cause of anxious and depressed mood, but emotions were blunted. She has since changed jobs and separated from her husband. Reports her mood did not improve due to financial stressor. She reports persistent fatigue, lack of motivations and loss of interest in things she typically enjoyed. Denies any SI/HI/hallucinations. She has therapy sessions every week.  Agreed to start wellbutrin 75mg  BID, continue sessions with therapist F/up in 56month      Relevant Medications   buPROPion (WELLBUTRIN) 75 MG tablet   Leukocytosis    Possibly due to acute cystitis Resolved fever and urinary symptoms after completion of oral abx CBC    Component Value Date/Time   WBC 17.0 (H) 12/03/2021 1047   RBC 4.23 12/03/2021 1047   HGB 12.7 12/03/2021 1047   HCT 38.6 12/03/2021 1047   PLT 256.0 12/03/2021 1047   MCV 91.4 12/03/2021 1047   MCH 28.2 07/11/2016 0534   MCHC 32.8 12/03/2021 1047   RDW 13.5 12/03/2021 1047   LYMPHSABS 1.5 12/03/2021 1047   MONOABS 2.5 (H) 12/03/2021 1047   EOSABS 0.0 12/03/2021 1047   BASOSABS 0.0 12/03/2021 1047  Repeat cbc today      Relevant Orders   CBC with Differential/Platelet   Vitamin D deficiency    Persistent fatigue and muscle weakness Repeat vit. D      Relevant Medications    Vitamin D, Ergocalciferol, (DRISDOL) 1.25 MG (50000 UNIT) CAPS capsule   Other Relevant Orders   Vitamin D (25 hydroxy)   Return in about 4 weeks (around 01/19/2022) for depression and weight management.     12/05/2021, NP

## 2021-12-22 NOTE — Assessment & Plan Note (Signed)
Has difficulty with maintain heart healthy diet and daily exercise. Reports she is aware of possible complications caused by obesity. Wt Readings from Last 3 Encounters:  12/22/21 209 lb 3.2 oz (94.9 kg)  12/03/21 212 lb 9.6 oz (96.4 kg)  04/29/21 214 lb (97.1 kg)   Provided information on how to increase daily exercise, make heart healthy meal choices and decrease portion size. F/up in 12month

## 2021-12-22 NOTE — Assessment & Plan Note (Addendum)
D/c fluoxetine several months ago. Fluoxetine provided some improvement in mood Reports marital discord and work stress as cause of anxious and depressed mood, but emotions were blunted. She has since changed jobs and separated from her husband. Reports her mood did not improve due to financial stressor. She reports persistent fatigue, lack of motivations and loss of interest in things she typically enjoyed. Denies any SI/HI/hallucinations. She has therapy sessions every week.  Agreed to start wellbutrin 75mg  BID, continue sessions with therapist F/up in 73month

## 2021-12-22 NOTE — Patient Instructions (Signed)
Go to lab Start wellbutrin 75mg  BID Start daily exercise: walking 10-57mins per day Maintain heart healthy diet.  Managing Depression, Adult Depression is a mental health condition that affects your thoughts, feelings, and actions. Being diagnosed with depression can bring you relief if you did not know why you have felt or behaved a certain way. It could also leave you feeling overwhelmed with uncertainty about your future. Preparing yourself to manage your symptoms can help you feel more positive about your future. How to manage lifestyle changes Managing stress  Stress is your body's reaction to life changes and events, both good and bad. Stress can add to your feelings of depression. Learning to manage your stress can help lessen your feelings of depression. Try some of the following approaches to reducing your stress (stress reduction techniques): Listen to music that you enjoy and that inspires you. Try using a meditation app or take a meditation class. Develop a practice that helps you connect with your spiritual self. Walk in nature, pray, or go to a place of worship. Do some deep breathing. To do this, inhale slowly through your nose. Pause at the top of your inhale for a few seconds and then exhale slowly, letting your muscles relax. Practice yoga to help relax and work your muscles. Choose a stress reduction technique that suits your lifestyle and personality. These techniques take time and practice to develop. Set aside 5-15 minutes a day to do them. Therapists can offer training in these techniques. Other things you can do to manage stress include: Keeping a stress diary. Knowing your limits and saying no when you think something is too much. Paying attention to how you react to certain situations. You may not be able to control everything, but you can change your reaction. Adding humor to your life by watching funny films or TV shows. Making time for activities that you enjoy and  that relax you.  Medicines Medicines, such as antidepressants, are often a part of treatment for depression. Talk with your pharmacist or health care provider about all the medicines, supplements, and herbal products that you take, their possible side effects, and what medicines and other products are safe to take together. Make sure to report any side effects you may have to your health care provider. Relationships Your health care provider may suggest family therapy, couples therapy, or individual therapy as part of your treatment. How to recognize changes Everyone responds differently to treatment for depression. As you recover from depression, you may start to: Have more interest in doing activities. Feel less hopeless. Have more energy. Overeat less often, or have a better appetite. Have better mental focus. It is important to recognize if your depression is not getting better or is getting worse. The symptoms you had in the beginning may return, such as: Tiredness (fatigue) or low energy. Eating too much or too little. Sleeping too much or too little. Feeling restless, agitated, or hopeless. Trouble focusing or making decisions. Unexplained physical complaints. Feeling irritable, angry, or aggressive. If you or your family members notice these symptoms coming back, let your health care provider know right away. Follow these instructions at home: Activity  Try to get some form of exercise each day, such as walking, biking, swimming, or lifting weights. Practice stress reduction techniques. Engage your mind by taking a class or doing some volunteer work. Lifestyle Get the right amount and quality of sleep. Cut down on using caffeine, tobacco, alcohol, and other potentially harmful substances. Eat a healthy  diet that includes plenty of vegetables, fruits, whole grains, low-fat dairy products, and lean protein. Do not eat a lot of foods that are high in solid fats, added sugars, or  salt (sodium). General instructions Take over-the-counter and prescription medicines only as told by your health care provider. Keep all follow-up visits as told by your health care provider. This is important. Where to find support Talking to others  Friends and family members can be sources of support and guidance. Talk to trusted friends or family members about your condition. Explain your symptoms to them, and let them know that you are working with a health care provider to treat your depression. Tell friends and family members how they also can be helpful. Finances Find appropriate mental health providers that fit with your financial situation. Talk with your health care provider about options to get reduced prices on your medicines. Where to find more information You can find support in your area from: Anxiety and Depression Association of America (ADAA): www.adaa.org Mental Health America: www.mentalhealthamerica.net [employer] on Mental Illness: www.nami.org Contact a health care provider if: You stop taking your antidepressant medicines, and you have any of these symptoms: Nausea. Headache. Light-headedness. Chills and body aches. Not being able to sleep (insomnia). You or your friends and family think your depression is getting worse. Get help right away if: You have thoughts of hurting yourself or others. If you ever feel like you may hurt yourself or others, or have thoughts about taking your own life, get help right away. Go to your nearest emergency department or: Call your local emergency services (911 in the [place] Call a suicide crisis helpline, such as the National Suicide Prevention Lifeline at (725)492-4908 or 988 in the [place] This is open 24 hours a day in the [place] Text the Crisis Text Line at 6033544052 (in the [place] Summary If you are diagnosed with depression, preparing yourself to manage your symptoms is a good way to feel positive about your future. Work  with your health care provider on a management plan that includes stress reduction techniques, medicines (if applicable), therapy, and healthy lifestyle habits. Keep talking with your health care provider about how your treatment is working. If you have thoughts about taking your own life, call a suicide crisis helpline or text a crisis text line. This information is not intended to replace advice given to you by your health care provider. Make sure you discuss any questions you have with your health care provider. Document Revised: 12/23/2020 Document Reviewed: 04/10/2019 Elsevier Patient Education  2023 [employer].

## 2021-12-22 NOTE — Assessment & Plan Note (Signed)
Persistent fatigue and muscle weakness Repeat vit. D

## 2021-12-28 ENCOUNTER — Ambulatory Visit (INDEPENDENT_AMBULATORY_CARE_PROVIDER_SITE_OTHER): Payer: BC Managed Care – PPO | Admitting: Psychology

## 2021-12-28 DIAGNOSIS — F331 Major depressive disorder, recurrent, moderate: Secondary | ICD-10-CM

## 2021-12-28 NOTE — Progress Notes (Signed)
[facility] Counselor/Therapist Progress Note  Patient ID: Quintavia Rogstad, MRN: [id number],    Date: 12/28/2021  Time Spent: 8:00am-8:54am   Treatment Type: Individual Therapy Pt is seen for virtual video visit via caregility.  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   Reported Symptoms: Pt reported some improvement w/ motivation/intention/acceptance.  Appearance:  Well Groomed     Behavior: Appropriate  Motor: Normal  Speech/Language:  Normal Rate  Affect: Appropriate  Mood: normal  Thought process: normal  Thought content:   WNL  Sensory/Perceptual disturbances:   WNL  Orientation: oriented to person, place, time/date, and situation  Attention: Good  Concentration: Good  Memory: WNL  Fund of knowledge:  Good  Insight:   Good  Judgment:  Good  Impulse Control: Good   Risk Assessment: Danger to Self:  No Self-injurious Behavior: No Danger to Others: No Duty to Warn:no Physical Aggression / Violence:No  Access to Firearms a concern: No  Gang Involvement:No   Subjective: Counselor assessed pt current functioning per pt report.  Processed w/pt mood, motivation, intention.  Assisted pt focus on small steps for change and keeping consistent w/ those small steps for couple weeks.  Pt affect wnl.  Pt reported she did see her PCP and started on Wellbutrin and discussed similar strategies discussed in counseling.  Pt reported that she is focusing on couple small doable things daily and allowing for grace/flexibility. Pt reported on time spent w/ mom over weekend following first chemo tx and how this was emotional at times.  Pt feeling encouraged by steps taking this past week.  Pt also reports taking supplements for Vitamin D and B12 as those continue to be low.   Interventions: Cognitive Behavioral Therapy, Insight-Oriented, and ACT  Diagnosis:Major depressive disorder, recurrent episode, moderate (HCC)  Plan: Pt to f/u 1 week  for counseling to continue building coping skills for reducing depression.   Pt to f/u as scheduled w/ PCP for medication management.   Treatment Plan 10/19/21 Pt participated in development of plan and provided verbal consent Client Abilities/Strengths  supports: Best friend and mom.   Enjoys: reading   Client Treatment Preferences  pt to continue w/ counseling every 1-2 weeks. pt to continue w/ PCP for medication management.  Client Statement of Needs  "I have seen improvements and my depression isn't as bad as used to be.  I want to explore what makes me happy and gives me joy.  I want to continue to find my voice.  I want to find my determination to meet my health goals".    Treatment Level  outpatient counseling  Symptoms  Depressed or irritable mood. Difficulty w/ motivation. Lack of energy. Low self-esteem- asserting self   Problems Addressed  Unipolar Depression  Goals 1. Alleviate depressive symptoms and return to previous level of effective functioning.   Objective Implement mindfulness techniques for relapse prevention. Target Date: 2022-10-20             Frequency: Daily Progress: 50         Modality: individual Related Interventions           Use mindfulness meditation and cognitive therapy techniques to help the client learn to recognize and regulate the negative thought processes associated with depression and to change his/her relationship with these thoughts (see Mindfulness-Based Cognitive Therapy for Depression by Chevis Pretty, and Shona Simpson).   Objective Increasingly verbalize hopeful and positive statements regarding self, others, and the  future. Target Date: 2022-10-20             Frequency: Daily Progress: 70         Modality: individual Related Interventions           . Objective Learn and implement conflict resolution skills to resolve interpersonal problems. Target Date: 2022-10-20             Frequency: Daily Progress: 50         Modality:  individual Related Interventions 3.           Teach conflict resolution skills (e.g., empathy, active listening, "I messages," respectful communication, assertiveness without aggression, compromise); use psychoeducation, modeling, role-playing, and rehearsal to work through several current conflicts; assign homework exercises; review and repeat so as to integrate their use into the client's life. Objective   Learn and implement behavioral strategies to overcome depression. Target Date: 2022-10-20             Frequency: Daily Progress: 50         Modality: individual Related Interventions            Engage the client in "behavioral activation," increasing his/her activity level and contact with sources of reward, while identifying processes that inhibit activation (see Behavioral Activation for Depression by Katharine Look, Dimidjian, and Herman-Dunn; or assign "Identify and Schedule Pleasant Activities" in the Adult Psychotherapy Homework Planner by [facility]); use behavioral techniques such as instruction, rehearsal, role-playing, role reversal, as needed, to facilitate activity in the client's daily life; reinforce success.   Objective Identify and replace thoughts and beliefs that support depression. Target Date: 2022-10-20             Frequency: Daily Progress: 70         Modality: individual Related Interventions 5.           Conduct Cognitive-Behavioral Therapy (see Cognitive Behavior Therapy by Reola Calkins; Overcoming Depression by Agapito Games al.), beginning with helping the client learn the connection among cognition, depressive feelings, and actions.  Pt participated in development of plan and provided verbal consent.      Forde Radon, [facility]

## 2022-01-04 ENCOUNTER — Ambulatory Visit: Payer: BC Managed Care – PPO | Admitting: Psychology

## 2022-01-06 ENCOUNTER — Ambulatory Visit (INDEPENDENT_AMBULATORY_CARE_PROVIDER_SITE_OTHER): Payer: BC Managed Care – PPO | Admitting: Psychology

## 2022-01-06 DIAGNOSIS — F331 Major depressive disorder, recurrent, moderate: Secondary | ICD-10-CM | POA: Diagnosis not present

## 2022-01-06 NOTE — Progress Notes (Signed)
[facility] Counselor/Therapist Progress Note  Patient ID: Wanda Hammond, MRN: [id number],    Date: 01/06/2022  Time Spent: 8:00am-8:40am   Treatment Type: Individual Therapy Pt is seen for virtual video visit via caregility.  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   Reported Symptoms: Pt reported stressors w/ work and family this past week.  Pt reported focus on mindfulness.  Appearance:  Well Groomed     Behavior: Appropriate  Motor: Normal  Speech/Language:  Normal Rate  Affect: Appropriate  Mood: normal  Thought process: normal  Thought content:   WNL  Sensory/Perceptual disturbances:   WNL  Orientation: oriented to person, place, time/date, and situation  Attention: Good  Concentration: Good  Memory: WNL  Fund of knowledge:  Good  Insight:   Good  Judgment:  Good  Impulse Control: Good   Risk Assessment: Danger to Self:  No Self-injurious Behavior: No Danger to Others: No Duty to Warn:no Physical Aggression / Violence:No  Access to Firearms a concern: No  Gang Involvement:No   Subjective: Counselor assessed pt current functioning per pt report.  Processed w/pt stressors in past week, recognizing impact and how coping. Discussed pt use of mindful presence and taking continued small steps.   Pt affect wnl.  Pt reported significant stressors in past week.  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  Pt reported that last week also mom ended up in hospital for fever and low white blood cell and then learned that her paternal grandmother was put in hospice. Pt discussed how she has felt overwhelmed, has allowed self to feeling emotions and focused on taking step at time.  Pt reports she feels she is managing well given circumstances and that use of her coping skills assisting.    Interventions: Cognitive  Behavioral Therapy, Insight-Oriented, and ACT  Diagnosis:Major depressive disorder, recurrent episode, moderate (HCC)  Plan: Pt to f/u 2 weeks for counseling to continue building coping skills for reducing depression.   Pt to f/u as scheduled w/ PCP for medication management.   Treatment Plan 10/19/21 Pt participated in development of plan and provided verbal consent Client Abilities/Strengths  supports: Best friend and mom.   Enjoys: reading   Client Treatment Preferences  pt to continue w/ counseling every 1-2 weeks. pt to continue w/ PCP for medication management.  Client Statement of Needs  "I have seen improvements and my depression isn't as bad as used to be.  I want to explore what makes me happy and gives me joy.  I want to continue to find my voice.  I want to find my determination to meet my health goals".    Treatment Level  outpatient counseling  Symptoms  Depressed or irritable mood. Difficulty w/ motivation. Lack of energy. Low self-esteem- asserting self   Problems Addressed  Unipolar Depression  Goals 1. Alleviate depressive symptoms and return to previous level of effective functioning.   Objective Implement mindfulness techniques for relapse prevention. Target Date: 2022-10-20             Frequency: Daily Progress: 50         Modality: individual Related Interventions           Use mindfulness meditation and cognitive therapy techniques to help the client learn to recognize and regulate the negative thought processes associated with depression and to  change his/her relationship with these thoughts (see Mindfulness-Based Cognitive Therapy for Depression by Chevis Pretty, and Shona Simpson).   Objective Increasingly verbalize hopeful and positive statements regarding self, others, and the future. Target Date: 2022-10-20             Frequency: Daily Progress: 70         Modality: individual Related Interventions           . Objective Learn and implement conflict  resolution skills to resolve interpersonal problems. Target Date: 2022-10-20             Frequency: Daily Progress: 50         Modality: individual Related Interventions 3.           Teach conflict resolution skills (e.g., empathy, active listening, "I messages," respectful communication, assertiveness without aggression, compromise); use psychoeducation, modeling, role-playing, and rehearsal to work through several current conflicts; assign homework exercises; review and repeat so as to integrate their use into the client's life. Objective   Learn and implement behavioral strategies to overcome depression. Target Date: 2022-10-20             Frequency: Daily Progress: 50         Modality: individual Related Interventions            Engage the client in "behavioral activation," increasing his/her activity level and contact with sources of reward, while identifying processes that inhibit activation (see Behavioral Activation for Depression by Katharine Look, Dimidjian, and Herman-Dunn; or assign "Identify and Schedule Pleasant Activities" in the Adult Psychotherapy Homework Planner by [facility]); use behavioral techniques such as instruction, rehearsal, role-playing, role reversal, as needed, to facilitate activity in the client's daily life; reinforce success.   Objective Identify and replace thoughts and beliefs that support depression. Target Date: 2022-10-20             Frequency: Daily Progress: 70         Modality: individual Related Interventions 5.           Conduct Cognitive-Behavioral Therapy (see Cognitive Behavior Therapy by Reola Calkins; Overcoming Depression by Agapito Games al.), beginning with helping the client learn the connection among cognition, depressive feelings, and actions.  Pt participated in development of plan and provided verbal consent.      Forde Radon, [facility]

## 2022-01-14 NOTE — Telephone Encounter (Signed)
1st no show w/in 12 months, fee waived, letter sent KO

## 2022-01-18 ENCOUNTER — Ambulatory Visit (INDEPENDENT_AMBULATORY_CARE_PROVIDER_SITE_OTHER): Payer: BC Managed Care – PPO | Admitting: Psychology

## 2022-01-18 DIAGNOSIS — F331 Major depressive disorder, recurrent, moderate: Secondary | ICD-10-CM | POA: Diagnosis not present

## 2022-01-18 NOTE — Progress Notes (Signed)
[facility] Counselor/Therapist Progress Note  Patient ID: Wanda Hammond, MRN: [id number],    Date: 01/18/2022  Time Spent: 8:00am-8:57am   Treatment Type: Individual Therapy Pt is seen for virtual video visit via caregility.  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   Reported Symptoms: Pt reported being more assertive.  Pt reported improved mood.  Pt reports still struggling w/ feeling low energy.  Appearance:  Well Groomed     Behavior: Appropriate  Motor: Normal  Speech/Language:  Normal Rate  Affect: Appropriate  Mood: normal  Thought process: normal  Thought content:   WNL  Sensory/Perceptual disturbances:   WNL  Orientation: oriented to person, place, time/date, and situation  Attention: Good  Concentration: Good  Memory: WNL  Fund of knowledge:  Good  Insight:   Good  Judgment:  Good  Impulse Control: Good   Risk Assessment: Danger to Self:  No Self-injurious Behavior: No Danger to Others: No Duty to Warn:no Physical Aggression / Violence:No  Access to Firearms a concern: No  Gang Involvement:No   Subjective: Counselor assessed pt current functioning per pt report.  Processed w/pt stressors positives and mood.  Reflected asserting boundaries and discussed outcomes of doing.  Explored ways of being intentional and consistent w/ routine.  Pt affect wnl.  Pt reported feels that mood has been improving and more motivation.  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  Pt reports setting boundaries w/ friend and learning how to not let things build up and address setting boundaries and being assertive.  Pt reports how this has been difficult for her as doesn't want to upset others, but recognizing that feels better when does assert.  Pt reports she feels that her sleep scheduled is more consistent but still feels low energy and easily fatigued during the day.  Pt will  f/u w/ PCP re:Marland Kitchen    Interventions: Cognitive Behavioral Therapy, Insight-Oriented, and ACT  Diagnosis:Major depressive disorder, recurrent episode, moderate (HCC)  Plan: Pt to f/u 2 weeks for counseling to continue building coping skills for reducing depression.   Pt to f/u as scheduled w/ PCP for medication management.   Treatment Plan 10/19/21 Pt participated in development of plan and provided verbal consent Client Abilities/Strengths  supports: Best friend and mom.   Enjoys: reading   Client Treatment Preferences  pt to continue w/ counseling every 1-2 weeks. pt to continue w/ PCP for medication management.  Client Statement of Needs  "I have seen improvements and my depression isn't as bad as used to be.  I want to explore what makes me happy and gives me joy.  I want to continue to find my voice.  I want to find my determination to meet my health goals".    Treatment Level  outpatient counseling  Symptoms  Depressed or irritable mood. Difficulty w/ motivation. Lack of energy. Low self-esteem- asserting self   Problems Addressed  Unipolar Depression  Goals 1. Alleviate depressive symptoms and return to previous level of effective functioning.   Objective Implement mindfulness techniques for relapse prevention. Target Date: 2022-10-20             Frequency: Daily Progress: 50         Modality: individual Related Interventions           Use mindfulness meditation and cognitive therapy techniques to help the client learn to recognize and regulate the negative thought  processes associated with depression and to change his/her relationship with these thoughts (see Mindfulness-Based Cognitive Therapy for Depression by Chevis Pretty, and Shona Simpson).   Objective Increasingly verbalize hopeful and positive statements regarding self, others, and the future. Target Date: 2022-10-20             Frequency: Daily Progress: 70         Modality: individual Related Interventions            . Objective Learn and implement conflict resolution skills to resolve interpersonal problems. Target Date: 2022-10-20             Frequency: Daily Progress: 50         Modality: individual Related Interventions 3.           Teach conflict resolution skills (e.g., empathy, active listening, "I messages," respectful communication, assertiveness without aggression, compromise); use psychoeducation, modeling, role-playing, and rehearsal to work through several current conflicts; assign homework exercises; review and repeat so as to integrate their use into the client's life. Objective   Learn and implement behavioral strategies to overcome depression. Target Date: 2022-10-20             Frequency: Daily Progress: 50         Modality: individual Related Interventions            Engage the client in "behavioral activation," increasing his/her activity level and contact with sources of reward, while identifying processes that inhibit activation (see Behavioral Activation for Depression by Katharine Look, Dimidjian, and Herman-Dunn; or assign "Identify and Schedule Pleasant Activities" in the Adult Psychotherapy Homework Planner by [facility]); use behavioral techniques such as instruction, rehearsal, role-playing, role reversal, as needed, to facilitate activity in the client's daily life; reinforce success.   Objective Identify and replace thoughts and beliefs that support depression. Target Date: 2022-10-20             Frequency: Daily Progress: 70         Modality: individual Related Interventions 5.           Conduct Cognitive-Behavioral Therapy (see Cognitive Behavior Therapy by Reola Calkins; Overcoming Depression by Agapito Games al.), beginning with helping the client learn the connection among cognition, depressive feelings, and actions.  Pt participated in development of plan and provided verbal consent.    Forde Radon, [facility]

## 2022-01-20 ENCOUNTER — Encounter: Payer: Self-pay | Admitting: Nurse Practitioner

## 2022-01-20 ENCOUNTER — Ambulatory Visit: Payer: BC Managed Care – PPO | Admitting: Nurse Practitioner

## 2022-01-20 VITALS — BP 112/62 | HR 82 | Temp 97.4°F | Ht 64.0 in | Wt 206.2 lb

## 2022-01-20 DIAGNOSIS — F339 Major depressive disorder, recurrent, unspecified: Secondary | ICD-10-CM

## 2022-01-20 DIAGNOSIS — Z6835 Body mass index (BMI) 35.0-35.9, adult: Secondary | ICD-10-CM | POA: Diagnosis not present

## 2022-01-20 NOTE — Patient Instructions (Signed)
Maintain current medication dose Continue heart healthy diet and daily exercise: of moderate intensity exercise per week.  How to Increase Your Level of Physical Activity Getting regular physical activity is important for your overall health and well-being. Most people do not get enough exercise. There are easy ways to increase your level of physical activity, even if you have not been very active in the past or if you are just starting out. What are the benefits of physical activity? Physical activity has many short-term and long-term benefits. Being active on a regular basis can improve your physical and mental health as well as provide other benefits. Physical health benefits Helping you lose weight or maintain a healthy weight. Strengthening your muscles and bones. Reducing your risk of certain long-term (chronic) diseases, including heart disease, cancer, and diabetes. Being able to move around more easily and for longer periods of time without getting tired (increased endurance or stamina). Improving your ability to fight off illness (enhanced immunity). Being able to sleep better. Helping you stay healthy as you get older, including: Helping you stay mobile, or capable of walking and moving around. Preventing accidents, such as falls. Increasing life expectancy. Mental health benefits Boosting your mood and improving your self-esteem. Lowering your chance of having mental health problems, such as depression or anxiety. Helping you feel good about your body. Other benefits Finding new sources of fun and enjoyment. Meeting new people who share a common interest. Before you begin If you have a chronic illness or have not been active for a while, check with your health care provider about how to get started. Ask your health care provider what activities are safe for you. Start out slowly. Walking or doing some simple chair exercises is a good place to start, especially if you  have not been active before or for a long time. Set goals that you can work toward. Ask your health care provider how much exercise is best for you. In general, most adults should: Do moderate-intensity exercise for at least 150 minutes each week (30 minutes on most days of the week) or vigorous exercise for at least 75 minutes each week, or a combination of these. Moderate-intensity exercise can include walking at a quick pace, biking, yoga, water aerobics, or gardening. Vigorous exercise involves activities that take more effort, such as jogging or running, playing sports, swimming laps, or jumping rope. Do strength exercises on at least 2 days each week. This can include weight lifting, body weight exercises, and resistance-band exercises. How to be more physically active Make a plan  Try to find activities that you enjoy. You are more likely to commit to an exercise routine if it does not feel like a chore. If you have bone or joint problems, choose low-impact exercises, like walking or swimming. Use these tips for being successful with an exercise plan: Find a workout partner for accountability. Join a group or class, such as an aerobics class, cycling class, or sports team. Make family time active. Go for a walk, bike, or swim. Include a variety of exercises each week. Consider using a fitness tracker, such as a mobile phone app or a device worn like a watch, that will count the number of steps you take each day. Many people strive to reach 10,000 steps a day. Find ways to be active in your daily routines Besides your formal exercise plans, you can find ways to do physical activity during your daily routines, such as: Walking or biking to work  or to the store. Taking the stairs instead of the elevator. Parking farther away from the door at work or at the store. Planning walking meetings. Walking around while you are on the phone. Where to find more information Centers for Disease  Control and Prevention: CampusC[employer].com.pt President's Council on Fitness, Sports & Nutrition: www.fitness.gov ChooseMyPlate: http://www.harvey.com/ Contact a health care provider if: You have headaches, muscle aches, or joint pain that is concerning. You feel dizzy or light-headed while exercising. You faint. You feel your heart skipping, racing, or fluttering. You have chest pain while exercising. Summary Exercise benefits your mind and body at any age, even if you are just starting out. If you have a chronic illness or have not been active for a while, check with your health care provider before increasing your physical activity. Choose activities that are safe and enjoyable for you. Ask your health care provider what activities are safe for you. Start slowly. Tell your health care provider if you have problems as you start to increase your activity level. This information is not intended to replace advice given to you by your health care provider. Make sure you discuss any questions you have with your health care provider. Document Revised: 09/25/2020 Document Reviewed: 09/25/2020 Elsevier Patient Education  2023 [employer].

## 2022-01-20 NOTE — Progress Notes (Signed)
Established Patient Visit  Patient: Wanda Hammond   DOB: 06/28/1985   36 y.o. Female  MRN: [id number] Visit Date: 01/20/2022  Subjective:    Chief Complaint  Patient presents with   Office Visit    4 week f/u depression & weight management  Still feels fatigue  No other concerns    HPI Depression, recurrent (HCC) Improving and stable mood No adverse effects with wellbutrin. Continue weekly counseling sessions maintain med dose F/up in 15month  Class 2 severe obesity due to excess calories with serious comorbidity and body mass index (BMI) of 35.0 to 35.9 in adult [facility]) Lost 3lbs in last 4weeks Has maintain healthy diet and small meal portions. Start exercise 10-2mins 3-4x/week Wt Readings from Last 3 Encounters:  01/20/22 206 lb 3.2 oz (93.5 kg)  12/22/21 209 lb 3.2 oz (94.9 kg)  12/03/21 212 lb 9.6 oz (96.4 kg)   Advised to increased exercise to per week     01/20/2022    8:28 AM 12/22/2021    8:56 AM 12/03/2021   10:22 AM  Depression screen PHQ 2/9  Decreased Interest 1 3 0  Down, Depressed, Hopeless 1 2 0  PHQ - 2 Score 2 5 0  Altered sleeping 2 3   Tired, decreased energy 3 3   Change in appetite 3 3   Feeling bad or failure about yourself  2 3   Trouble concentrating 1 1   Moving slowly or fidgety/restless 0 1   Suicidal thoughts 0 0   PHQ-9 Score 13 19   Difficult doing work/chores Somewhat difficult Extremely dIfficult        01/20/2022    8:28 AM 12/22/2021    8:56 AM 08/31/2020    9:15 AM 07/30/2020    9:37 AM  GAD 7 : Generalized Anxiety Score  Nervous, Anxious, on Edge 2 2 1 3   Control/stop worrying 2 3 3 3   Worry too much - different things 2 3 2 2   Trouble relaxing 1 2 2 1   Restless 1 2 2 1   Easily annoyed or irritable 3 2 3 3   Afraid - awful might happen 0 1 2 1   Total GAD 7 Score 11 15 15 14   Anxiety Difficulty Somewhat difficult Very difficult Very difficult Very difficult     Wt Readings from Last 3  Encounters:  01/20/22 206 lb 3.2 oz (93.5 kg)  12/22/21 209 lb 3.2 oz (94.9 kg)  12/03/21 212 lb 9.6 oz (96.4 kg)    Reviewed medical, surgical, and social history today  Medications: Outpatient Medications Prior to Visit  Medication Sig   buPROPion (WELLBUTRIN) 75 MG tablet Take 1 tablet (75 mg total) by mouth 2 (two) times daily.   levonorgestrel (MIRENA) 20 MCG/24HR IUD Mirena 20 mcg/24 hours (5 yrs) 52 mg intrauterine device  Take 1 insert by intrauterine route.   vitamin B-12 (CYANOCOBALAMIN) 1000 MCG tablet Take 1 tablet (1,000 mcg total) by mouth daily.   Vitamin D, Ergocalciferol, (DRISDOL) 1.25 MG (50000 UNIT) CAPS capsule Take 1 capsule (50,000 Units total) by mouth every 7 (seven) days.   No facility-administered medications prior to visit.   Reviewed past medical and social history.   ROS per HPI above      Objective:  BP 112/62 (BP Location: Right Arm, Patient Position: Sitting, Cuff Size: Normal)   Pulse 82   Temp (!) 97.4 F (36.3 C) (Temporal)  Ht 5\' 4"  (1.626 m)   Wt 206 lb 3.2 oz (93.5 kg)   SpO2 97%   BMI 35.39 kg/m      Physical Exam Cardiovascular:     Rate and Rhythm: Normal rate.     Pulses: Normal pulses.  Pulmonary:     Effort: Pulmonary effort is normal.  Neurological:     Mental Status: She is alert and oriented to person, place, and time.  Psychiatric:        Mood and Affect: Mood normal.        Behavior: Behavior normal.        Thought Content: Thought content normal.     No results found for any visits on 01/20/22.    Assessment & Plan:    Problem List Items Addressed This Visit       Other   Class 2 severe obesity due to excess calories with serious comorbidity and body mass index (BMI) of 35.0 to 35.9 in adult [facility]) - Primary    Lost 3lbs in last 4weeks Has maintain healthy diet and small meal portions. Start exercise 10-56mins 3-4x/week Wt Readings from Last 3 Encounters:  01/20/22 206 lb 3.2 oz (93.5 kg)  12/22/21 209  lb 3.2 oz (94.9 kg)  12/03/21 212 lb 9.6 oz (96.4 kg)   Advised to increased exercise to 12/05/21 per week      Depression, recurrent (HCC)    Improving and stable mood No adverse effects with wellbutrin. Continue weekly counseling sessions maintain med dose F/up in 6month      Return in about 4 weeks (around 02/17/2022) for depression and anxiety, Weight management (video).     04/19/2022, NP

## 2022-01-20 NOTE — Assessment & Plan Note (Signed)
Improving and stable mood No adverse effects with wellbutrin. Continue weekly counseling sessions maintain med dose F/up in 34month

## 2022-01-20 NOTE — Assessment & Plan Note (Signed)
Lost 3lbs in last 4weeks Has maintain healthy diet and small meal portions. Start exercise 10-32mins 3-4x/week Wt Readings from Last 3 Encounters:  01/20/22 206 lb 3.2 oz (93.5 kg)  12/22/21 209 lb 3.2 oz (94.9 kg)  12/03/21 212 lb 9.6 oz (96.4 kg)   Advised to increased exercise to per week

## 2022-01-25 ENCOUNTER — Ambulatory Visit (INDEPENDENT_AMBULATORY_CARE_PROVIDER_SITE_OTHER): Payer: BC Managed Care – PPO | Admitting: Psychology

## 2022-01-25 DIAGNOSIS — F331 Major depressive disorder, recurrent, moderate: Secondary | ICD-10-CM

## 2022-01-25 NOTE — Progress Notes (Signed)
[facility] Counselor/Therapist Progress Note  Patient ID: Wanda Hammond, MRN: [id number],    Date: 01/25/2022  Time Spent: 8:01am-8:54am   Treatment Type: Individual Therapy Pt is seen for virtual video visit via caregility.  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   Reported Symptoms: Pt reported setting boundaries and being assertive.  Pt reports improved mood.    Appearance:  Well Groomed     Behavior: Appropriate  Motor: Normal  Speech/Language:  Normal Rate  Affect: Appropriate  Mood: normal  Thought process: normal  Thought content:   WNL  Sensory/Perceptual disturbances:   WNL  Orientation: oriented to person, place, time/date, and situation  Attention: Good  Concentration: Good  Memory: WNL  Fund of knowledge:  Good  Insight:   Good  Judgment:  Good  Impulse Control: Good   Risk Assessment: Danger to Self:  No Self-injurious Behavior: No Danger to Others: No Duty to Warn:no Physical Aggression / Violence:No  Access to Firearms a concern: No  Gang Involvement:No   Subjective: Counselor assessed pt current functioning per pt report.  Processed w/pt interactions w/ mom and w/ ex.  Explored effective communication- assertive communication and active listening.  Explored boundaries she is establishing through separation.  Pt affect wnl.  Pt reported f/u w/ PCP and maintain current dose of Wellbutrin to give 8 weeks for effectiveness.  Pt reported on relationship w/ mom and concerns for her through chemo.  Pt identified how she engaged w/ mom to be a buffer during her marriage and now having to establish boundaries.  Pt reported her older sister is moving to [place] from [place].  Pt feels this will be a positive. Pt reported that she and exhusband are continuing to have good communication and discussing agreements for divorce.     Interventions: Cognitive Behavioral Therapy, Insight-Oriented, and ACT  Diagnosis:Major  depressive disorder, recurrent episode, moderate (HCC)  Plan: Pt to f/u 2 weeks for counseling to continue building coping skills for reducing depression.   Pt to f/u as scheduled w/ PCP for medication management.   Treatment Plan 10/19/21 Pt participated in development of plan and provided verbal consent Client Abilities/Strengths  supports: Best friend and mom.   Enjoys: reading   Client Treatment Preferences  pt to continue w/ counseling every 1-2 weeks. pt to continue w/ PCP for medication management.  Client Statement of Needs  "I have seen improvements and my depression isn't as bad as used to be.  I want to explore what makes me happy and gives me joy.  I want to continue to find my voice.  I want to find my determination to meet my health goals".    Treatment Level  outpatient counseling  Symptoms  Depressed or irritable mood. Difficulty w/ motivation. Lack of energy. Low self-esteem- asserting self   Problems Addressed  Unipolar Depression  Goals 1. Alleviate depressive symptoms and return to previous level of effective functioning.   Objective Implement mindfulness techniques for relapse prevention. Target Date: 2022-10-20             Frequency: Daily Progress: 50         Modality: individual Related Interventions           Use mindfulness meditation and cognitive therapy techniques to help the client learn to recognize and regulate the negative thought processes associated with depression and to change his/her relationship with these thoughts (see Mindfulness-Based Cognitive Therapy for Depression by Dairl Ponder,  Mayford Knife and [place]).   Objective Increasingly verbalize hopeful and positive statements regarding self, others, and the future. Target Date: 2022-10-20             Frequency: Daily Progress: 70         Modality: individual Related Interventions           . Objective Learn and implement conflict resolution skills to resolve interpersonal problems. Target Date:  2022-10-20             Frequency: Daily Progress: 50         Modality: individual Related Interventions 3.           Teach conflict resolution skills (e.g., empathy, active listening, "I messages," respectful communication, assertiveness without aggression, compromise); use psychoeducation, modeling, role-playing, and rehearsal to work through several current conflicts; assign homework exercises; review and repeat so as to integrate their use into the client's life. Objective   Learn and implement behavioral strategies to overcome depression. Target Date: 2022-10-20             Frequency: Daily Progress: 50         Modality: individual Related Interventions            Engage the client in "behavioral activation," increasing his/her activity level and contact with sources of reward, while identifying processes that inhibit activation (see Behavioral Activation for Depression by Katharine Look, Dimidjian, and Herman-Dunn; or assign "Identify and Schedule Pleasant Activities" in the Adult Psychotherapy Homework Planner by [facility]); use behavioral techniques such as instruction, rehearsal, role-playing, role reversal, as needed, to facilitate activity in the client's daily life; reinforce success.   Objective Identify and replace thoughts and beliefs that support depression. Target Date: 2022-10-20             Frequency: Daily Progress: 70         Modality: individual Related Interventions 5.           Conduct Cognitive-Behavioral Therapy (see Cognitive Behavior Therapy by Reola Calkins; Overcoming Depression by Agapito Games al.), beginning with helping the client learn the connection among cognition, depressive feelings, and actions.  Pt participated in development of plan and provided verbal consent.     Forde Radon, [facility]

## 2022-02-01 ENCOUNTER — Ambulatory Visit (INDEPENDENT_AMBULATORY_CARE_PROVIDER_SITE_OTHER): Payer: BC Managed Care – PPO | Admitting: Psychology

## 2022-02-01 DIAGNOSIS — F330 Major depressive disorder, recurrent, mild: Secondary | ICD-10-CM

## 2022-02-01 NOTE — Progress Notes (Signed)
[facility] Counselor/Therapist Progress Note  Patient ID: Wanda Hammond, MRN: [id number],    Date: 02/01/2022  Time Spent: 8:01am-8:43am   Treatment Type: Individual Therapy Pt is seen for virtual video visit via caregility.  Pt joins from her car at work reporting privacy and counselor from her home office.   Reported Symptoms: Pt reported continuing to setting boundaries for self.  Pt reports improved mood and not ruminating on work.    Appearance:  Well Groomed     Behavior: Appropriate  Motor: Normal  Speech/Language:  Normal Rate  Affect: Appropriate  Mood: normal  Thought process: normal  Thought content:   WNL  Sensory/Perceptual disturbances:   WNL  Orientation: oriented to person, place, time/date, and situation  Attention: Good  Concentration: Good  Memory: WNL  Fund of knowledge:  Good  Insight:   Good  Judgment:  Good  Impulse Control: Good   Risk Assessment: Danger to Self:  No Self-injurious Behavior: No Danger to Others: No Duty to Warn:no Physical Aggression / Violence:No  Access to Firearms a concern: No  Gang Involvement:No   Subjective: Counselor assessed pt current functioning per pt report.  Processed w/pt transition w/ sister coming to live w/ mom and stressors w/ work being bought.  Explored boundaries she is establishing.  Encouraged pt to explore active over the weekend to engage in exploring her interests.  Pt affect wnl.  Pt reported her sister did come to live w/ mom last weekend.  Pt reported on potential of needing to assert boundaries w/ sister.  Pt discussed interview for promotion and not ruminating on and focus on what's in her control and having a plan either way for her future.  Pt dicussed want to explore and find her interest and discussed options and committing to doing something this weekend.  Interventions: Cognitive Behavioral Therapy, Insight-Oriented, and ACT  Diagnosis:MDD (major depressive disorder),  recurrent episode, mild (HCC)  Plan: Pt to f/u 1 week for counseling to continue building coping skills for reducing depression.   Pt to f/u as scheduled w/ PCP for medication management.   Treatment Plan 10/19/21 Pt participated in development of plan and provided verbal consent Client Abilities/Strengths  supports: Best friend and mom.   Enjoys: reading   Client Treatment Preferences  pt to continue w/ counseling every 1-2 weeks. pt to continue w/ PCP for medication management.  Client Statement of Needs  "I have seen improvements and my depression isn't as bad as used to be.  I want to explore what makes me happy and gives me joy.  I want to continue to find my voice.  I want to find my determination to meet my health goals".    Treatment Level  outpatient counseling  Symptoms  Depressed or irritable mood. Difficulty w/ motivation. Lack of energy. Low self-esteem- asserting self   Problems Addressed  Unipolar Depression  Goals 1. Alleviate depressive symptoms and return to previous level of effective functioning.   Objective Implement mindfulness techniques for relapse prevention. Target Date: 2022-10-20             Frequency: Daily Progress: 50         Modality: individual Related Interventions           Use mindfulness meditation and cognitive therapy techniques to help the client learn to recognize and regulate the negative thought processes associated with depression and to change his/her relationship with these thoughts (see Mindfulness-Based Cognitive Therapy for Depression by Dairl Ponder,  Mayford Knife and [place]).   Objective Increasingly verbalize hopeful and positive statements regarding self, others, and the future. Target Date: 2022-10-20             Frequency: Daily Progress: 70         Modality: individual Related Interventions           . Objective Learn and implement conflict resolution skills to resolve interpersonal problems. Target Date: 2022-10-20              Frequency: Daily Progress: 50         Modality: individual Related Interventions 3.           Teach conflict resolution skills (e.g., empathy, active listening, "I messages," respectful communication, assertiveness without aggression, compromise); use psychoeducation, modeling, role-playing, and rehearsal to work through several current conflicts; assign homework exercises; review and repeat so as to integrate their use into the client's life. Objective   Learn and implement behavioral strategies to overcome depression. Target Date: 2022-10-20             Frequency: Daily Progress: 50         Modality: individual Related Interventions            Engage the client in "behavioral activation," increasing his/her activity level and contact with sources of reward, while identifying processes that inhibit activation (see Behavioral Activation for Depression by Katharine Look, Dimidjian, and Herman-Dunn; or assign "Identify and Schedule Pleasant Activities" in the Adult Psychotherapy Homework Planner by [facility]); use behavioral techniques such as instruction, rehearsal, role-playing, role reversal, as needed, to facilitate activity in the client's daily life; reinforce success.   Objective Identify and replace thoughts and beliefs that support depression. Target Date: 2022-10-20             Frequency: Daily Progress: 70         Modality: individual Related Interventions 5.           Conduct Cognitive-Behavioral Therapy (see Cognitive Behavior Therapy by Reola Calkins; Overcoming Depression by Agapito Games al.), beginning with helping the client learn the connection among cognition, depressive feelings, and actions.  Pt participated in development of plan and provided verbal consent.     Forde Radon, [facility]

## 2022-02-08 ENCOUNTER — Ambulatory Visit (INDEPENDENT_AMBULATORY_CARE_PROVIDER_SITE_OTHER): Payer: BC Managed Care – PPO | Admitting: Psychology

## 2022-02-08 DIAGNOSIS — F330 Major depressive disorder, recurrent, mild: Secondary | ICD-10-CM | POA: Diagnosis not present

## 2022-02-08 NOTE — Progress Notes (Signed)
[facility] Counselor/Therapist Progress Note  Patient ID: Wanda Hammond, MRN: [id number],    Date: 02/08/2022  Time Spent: 8:01am-8:46am   Treatment Type: Individual Therapy Pt is seen for virtual video visit via caregility.  Pt joins from her car at work reporting privacy and counselor from her home office.   Reported Symptoms: Pt reported increasing positive reframes.  Appearance:  Well Groomed     Behavior: Appropriate  Motor: Normal  Speech/Language:  Normal Rate  Affect: Appropriate  Mood: normal  Thought process: normal  Thought content:   WNL  Sensory/Perceptual disturbances:   WNL  Orientation: oriented to person, place, time/date, and situation  Attention: Good  Concentration: Good  Memory: WNL  Fund of knowledge:  Good  Insight:   Good  Judgment:  Good  Impulse Control: Good   Risk Assessment: Danger to Self:  No Self-injurious Behavior: No Danger to Others: No Duty to Warn:no Physical Aggression / Violence:No  Access to Firearms a concern: No  Gang Involvement:No   Subjective: Counselor assessed pt current functioning per pt report.  Processed w/pt positives and stressors and ways she is focusing on positives and not negatives.  Reflected pt insight and awareness and reframes she is making.  Assisted in reframing that "only did" on Saturday to acknowledged what did do.   Pt affect wnl.  Pt reported that work has had some stress w/ transitions and learning that taught things incorrectly.  Pt discussed how she is being intentional about what she can do and not what doesn't have control over and focus on the positives.  Pt discussed weekend and how planned to get things done but "only did" laundry.  Pt was able to acknowledge and reframe that she actually did a lot that day just not to her initial plan.  Pt discussed that w/ birthday coming up she is focusing on what she wants to move forward w/ and not what hasn't done.    Interventions:  Cognitive Behavioral Therapy, Insight-Oriented, and ACT  Diagnosis:MDD (major depressive disorder), recurrent episode, mild (HCC)  Plan: Pt to f/u 1 week for counseling to continue building coping skills for reducing depression.   Pt to f/u as scheduled w/ PCP for medication management.   Treatment Plan 10/19/21 Pt participated in development of plan and provided verbal consent Client Abilities/Strengths  supports: Best friend and mom.   Enjoys: reading   Client Treatment Preferences  pt to continue w/ counseling every 1-2 weeks. pt to continue w/ PCP for medication management.  Client Statement of Needs  "I have seen improvements and my depression isn't as bad as used to be.  I want to explore what makes me happy and gives me joy.  I want to continue to find my voice.  I want to find my determination to meet my health goals".    Treatment Level  outpatient counseling  Symptoms  Depressed or irritable mood. Difficulty w/ motivation. Lack of energy. Low self-esteem- asserting self   Problems Addressed  Unipolar Depression  Goals 1. Alleviate depressive symptoms and return to previous level of effective functioning.   Objective Implement mindfulness techniques for relapse prevention. Target Date: 2022-10-20             Frequency: Daily Progress: 50         Modality: individual Related Interventions           Use mindfulness meditation and cognitive therapy techniques to help the client learn to recognize and regulate  the negative thought processes associated with depression and to change his/her relationship with these thoughts (see Mindfulness-Based Cognitive Therapy for Depression by Chevis Pretty, and Shona Simpson).   Objective Increasingly verbalize hopeful and positive statements regarding self, others, and the future. Target Date: 2022-10-20             Frequency: Daily Progress: 70         Modality: individual Related Interventions           . Objective Learn and implement  conflict resolution skills to resolve interpersonal problems. Target Date: 2022-10-20             Frequency: Daily Progress: 50         Modality: individual Related Interventions 3.           Teach conflict resolution skills (e.g., empathy, active listening, "I messages," respectful communication, assertiveness without aggression, compromise); use psychoeducation, modeling, role-playing, and rehearsal to work through several current conflicts; assign homework exercises; review and repeat so as to integrate their use into the client's life. Objective   Learn and implement behavioral strategies to overcome depression. Target Date: 2022-10-20             Frequency: Daily Progress: 50         Modality: individual Related Interventions            Engage the client in "behavioral activation," increasing his/her activity level and contact with sources of reward, while identifying processes that inhibit activation (see Behavioral Activation for Depression by Katharine Look, Dimidjian, and Herman-Dunn; or assign "Identify and Schedule Pleasant Activities" in the Adult Psychotherapy Homework Planner by [facility]); use behavioral techniques such as instruction, rehearsal, role-playing, role reversal, as needed, to facilitate activity in the client's daily life; reinforce success.   Objective Identify and replace thoughts and beliefs that support depression. Target Date: 2022-10-20             Frequency: Daily Progress: 70         Modality: individual Related Interventions 5.           Conduct Cognitive-Behavioral Therapy (see Cognitive Behavior Therapy by Reola Calkins; Overcoming Depression by Agapito Games al.), beginning with helping the client learn the connection among cognition, depressive feelings, and actions.  Pt participated in development of plan and provided verbal consent.    Forde Radon, [facility]

## 2022-02-15 ENCOUNTER — Ambulatory Visit: Payer: BC Managed Care – PPO | Admitting: Psychology

## 2022-02-16 ENCOUNTER — Ambulatory Visit (INDEPENDENT_AMBULATORY_CARE_PROVIDER_SITE_OTHER): Payer: BC Managed Care – PPO | Admitting: Psychology

## 2022-02-16 DIAGNOSIS — F330 Major depressive disorder, recurrent, mild: Secondary | ICD-10-CM

## 2022-02-16 NOTE — Progress Notes (Signed)
[facility] Counselor/Therapist Progress Note  Patient ID: Wanda Hammond, MRN: [id number],    Date: 02/16/2022  Time Spent: 8:00am-8:38am   Treatment Type: Individual Therapy Pt is seen for virtual video visit via caregility.  Pt joins from her car at work reporting privacy and counselor from her home office.   Reported Symptoms: Pt reported mood good and less overwhelmed  Appearance:  Well Groomed     Behavior: Appropriate  Motor: Normal  Speech/Language:  Normal Rate  Affect: Appropriate  Mood: normal  Thought process: normal  Thought content:   WNL  Sensory/Perceptual disturbances:   WNL  Orientation: oriented to person, place, time/date, and situation  Attention: Good  Concentration: Good  Memory: WNL  Fund of knowledge:  Good  Insight:   Good  Judgment:  Good  Impulse Control: Good   Risk Assessment: Danger to Self:  No Self-injurious Behavior: No Danger to Others: No Duty to Warn:no Physical Aggression / Violence:No  Access to Firearms a concern: No  Gang Involvement:No   Subjective: Counselor assessed pt current functioning per pt report.  Processed w/pt positives, stressors and mood.  Explored impact of focus on only what is in her control.  Discussed financial stressors and encouraged to f/u w/ insurance company to understand her financial responsibility for counseling.   Pt affect wnl.  Pt reported that she had a good vacation w/ kids to beach.  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 Pt discussed how she has been able to focus on what is in her control and not things out of her control and that has helped to reduce feeling overwhelmed and disappointment, etc.  Pt discussed that she is gaining better understanding of her financial budget w/ daughter no longer in daycare.  Pt agrees need to better understand her coverage for counseling sessions to better plan for frequency.    Interventions:  Cognitive Behavioral Therapy, Insight-Oriented, and ACT  Diagnosis:MDD (major depressive disorder), recurrent episode, mild (HCC)  Plan: Pt to f/u 2 week for counseling to continue building coping skills for reducing depression.   Pt to f/u as scheduled w/ PCP for medication management.   Treatment Plan 10/19/21 Pt participated in development of plan and provided verbal consent Client Abilities/Strengths  supports: Best friend and mom.   Enjoys: reading   Client Treatment Preferences  pt to continue w/ counseling every 1-2 weeks. pt to continue w/ PCP for medication management.  Client Statement of Needs  "I have seen improvements and my depression isn't as bad as used to be.  I want to explore what makes me happy and gives me joy.  I want to continue to find my voice.  I want to find my determination to meet my health goals".    Treatment Level  outpatient counseling  Symptoms  Depressed or irritable mood. Difficulty w/ motivation. Lack of energy. Low self-esteem- asserting self   Problems Addressed  Unipolar Depression  Goals 1. Alleviate depressive symptoms and return to previous level of effective functioning.   Objective Implement mindfulness techniques for relapse prevention. Target Date: 2022-10-20             Frequency: Daily Progress: 50         Modality: individual Related Interventions           Use mindfulness meditation and cognitive therapy techniques to help the client learn to recognize and regulate the negative thought processes associated  with depression and to change his/her relationship with these thoughts (see Mindfulness-Based Cognitive Therapy for Depression by Chevis Pretty, and Shona Simpson).   Objective Increasingly verbalize hopeful and positive statements regarding self, others, and the future. Target Date: 2022-10-20             Frequency: Daily Progress: 70         Modality: individual Related Interventions           . Objective Learn and implement  conflict resolution skills to resolve interpersonal problems. Target Date: 2022-10-20             Frequency: Daily Progress: 50         Modality: individual Related Interventions 3.           Teach conflict resolution skills (e.g., empathy, active listening, "I messages," respectful communication, assertiveness without aggression, compromise); use psychoeducation, modeling, role-playing, and rehearsal to work through several current conflicts; assign homework exercises; review and repeat so as to integrate their use into the client's life. Objective   Learn and implement behavioral strategies to overcome depression. Target Date: 2022-10-20             Frequency: Daily Progress: 50         Modality: individual Related Interventions            Engage the client in "behavioral activation," increasing his/her activity level and contact with sources of reward, while identifying processes that inhibit activation (see Behavioral Activation for Depression by Katharine Look, Dimidjian, and Herman-Dunn; or assign "Identify and Schedule Pleasant Activities" in the Adult Psychotherapy Homework Planner by [facility]); use behavioral techniques such as instruction, rehearsal, role-playing, role reversal, as needed, to facilitate activity in the client's daily life; reinforce success.   Objective Identify and replace thoughts and beliefs that support depression. Target Date: 2022-10-20             Frequency: Daily Progress: 70         Modality: individual Related Interventions 5.           Conduct Cognitive-Behavioral Therapy (see Cognitive Behavior Therapy by Reola Calkins; Overcoming Depression by Agapito Games al.), beginning with helping the client learn the connection among cognition, depressive feelings, and actions.  Pt participated in development of plan and provided verbal consent.    Forde Radon, [facility]

## 2022-02-17 ENCOUNTER — Telehealth (INDEPENDENT_AMBULATORY_CARE_PROVIDER_SITE_OTHER): Payer: BC Managed Care – PPO | Admitting: Nurse Practitioner

## 2022-02-17 ENCOUNTER — Encounter: Payer: Self-pay | Admitting: Nurse Practitioner

## 2022-02-17 DIAGNOSIS — F3341 Major depressive disorder, recurrent, in partial remission: Secondary | ICD-10-CM | POA: Diagnosis not present

## 2022-02-17 MED ORDER — BUPROPION HCL ER (XL) 150 MG PO TB24: 150.0000 mg | ORAL_TABLET | Freq: Every day | ORAL | 3 refills | Status: AC

## 2022-02-17 NOTE — Progress Notes (Signed)
Virtual Visit via Video Note  I connected withNAME@ on 02/17/22 at  8:00 AM EDT by a video enabled telemedicine application and verified that I am speaking with the correct person using two identifiers.  Location: Patient:Home Provider: Office Participants: patient and provider  I discussed the limitations of evaluation and management by telemedicine and the availability of in person appointments. I also discussed with the patient that there may be a patient responsible charge related to this service. The patient expressed understanding and agreed to proceed.  TG:GYIRSWNIOE and anxiety f/up  History of Present Illness:  Depression, recurrent (HCC) Maintain current med dose Switched to 150mg  XR daily. F/up in 16months      02/17/2022    8:16 AM 01/20/2022    8:28 AM 12/22/2021    8:56 AM  Depression screen PHQ 2/9  Decreased Interest 1 1 3   Down, Depressed, Hopeless 0 1 2  PHQ - 2 Score 1 2 5   Altered sleeping 3 2 3   Tired, decreased energy 3 3 3   Change in appetite 1 3 3   Feeling bad or failure about yourself  2 2 3   Trouble concentrating 0 1 1  Moving slowly or fidgety/restless 0 0 1  Suicidal thoughts 0 0 0  PHQ-9 Score 10 13 19   Difficult doing work/chores Not difficult at all Somewhat difficult Extremely dIfficult       02/17/2022    8:17 AM 01/20/2022    8:28 AM 12/22/2021    8:56 AM 08/31/2020    9:15 AM  GAD 7 : Generalized Anxiety Score  Nervous, Anxious, on Edge 0 2 2 1   Control/stop worrying 2 2 3 3   Worry too much - different things 2 2 3 2   Trouble relaxing 2 1 2 2   Restless 0 1 2 2   Easily annoyed or irritable 3 3 2 3   Afraid - awful might happen 1 0 1 2  Total GAD 7 Score 10 11 15 15   Anxiety Difficulty Somewhat difficult Somewhat difficult Very difficult Very difficult   Observations/Objective: Alert and oriented x 4 normal mood and affect  Assessment and Plan: Kaneesha was seen today for phq/gad.  Diagnoses and all orders for this  visit:  Recurrent major depressive disorder, in partial remission (HCC) -     buPROPion (WELLBUTRIN XL) 150 MG 24 hr tablet; Take 1 tablet (150 mg total) by mouth daily.   Follow Up Instructions: See instructions above   I discussed the assessment and treatment plan with the patient. The patient was provided an opportunity to ask questions and all were answered. The patient agreed with the plan and demonstrated an understanding of the instructions.   The patient was advised to call back or seek an in-person evaluation if the symptoms worsen or if the condition fails to improve as anticipated.  , NP

## 2022-02-17 NOTE — Assessment & Plan Note (Signed)
Maintain current med dose Switched to 150mg  XR daily. F/up in 62months

## 2022-02-22 ENCOUNTER — Ambulatory Visit: Payer: BC Managed Care – PPO | Admitting: Psychology

## 2022-03-01 ENCOUNTER — Ambulatory Visit: Payer: BC Managed Care – PPO | Admitting: Psychology

## 2022-03-08 ENCOUNTER — Ambulatory Visit (INDEPENDENT_AMBULATORY_CARE_PROVIDER_SITE_OTHER): Payer: BC Managed Care – PPO | Admitting: Psychology

## 2022-03-08 DIAGNOSIS — F330 Major depressive disorder, recurrent, mild: Secondary | ICD-10-CM

## 2022-03-08 NOTE — Progress Notes (Signed)
[place] Counselor/Therapist Progress Note  Patient ID: Wanda Hammond, MRN: [id number],    Date: 03/08/2022  Time Spent: 8:01am-8:33am   Treatment Type: Individual Therapy Pt is seen for virtual video visit via caregility.  Pt joins from her car at work reporting privacy and counselor from her home office.   Reported Symptoms: Pt reported mood good and consistent w/ self care walks  Appearance:  Well Groomed     Behavior: Appropriate  Motor: Normal  Speech/Language:  Normal Rate  Affect: Appropriate  Mood: normal  Thought process: normal  Thought content:   WNL  Sensory/Perceptual disturbances:   WNL  Orientation: oriented to person, place, time/date, and situation  Attention: Good  Concentration: Good  Memory: WNL  Fund of knowledge:  Good  Insight:   Good  Judgment:  Good  Impulse Control: Good   Risk Assessment: Danger to Self:  No Self-injurious Behavior: No Danger to Others: No Duty to Warn:no Physical Aggression / Violence:No  Access to Firearms a concern: No  Gang Involvement:No   Subjective: Counselor assessed pt current functioning per pt report.  Processed w/pt mood and stressors.  Validated worries and reiterated focus on what's in her control and making decisions that align w/ values.  Discussed her consistency w/ self care walks and exploring ways to increase her physical activity that she desires. Pt affect wnl.  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 Pt discussed how still focused on what is in her control and that she is looking to keep her options open.  Pt reported that her schedule w/ kids changing as exhusband's work schedule changing.  Pt reports although not ideal feels good that working through together.  Pt reports she has consistently been walking each week and feels ready for more.  Pt explored ways to work in increased activity for self. Pt shared that  needs to shift to f/u every 2 weeks to meet her financial needs.    Interventions: Cognitive Behavioral Therapy, Insight-Oriented, and ACT  Diagnosis:MDD (major depressive disorder), recurrent episode, mild ([place])  Plan: Pt to f/u 2 weeks for counseling to continue building coping skills for reducing depression.   Pt to f/u as scheduled w/ PCP for medication management.   Treatment Plan 10/19/21 Pt participated in development of plan and provided verbal consent Client Abilities/Strengths  supports: Best friend and mom.   Enjoys: reading   Client Treatment Preferences  pt to continue w/ counseling every 1-2 weeks. pt to continue w/ PCP for medication management.  Client Statement of Needs  "I have seen improvements and my depression isn't as bad as used to be.  I want to explore what makes me happy and gives me joy.  I want to continue to find my voice.  I want to find my determination to meet my health goals".    Treatment Level  outpatient counseling  Symptoms  Depressed or irritable mood. Difficulty w/ motivation. Lack of energy. Low self-esteem- asserting self   Problems Addressed  Unipolar Depression  Goals 1. Alleviate depressive symptoms and return to previous level of effective functioning.   Objective Implement mindfulness techniques for relapse prevention. Target Date: 2022-10-20             Frequency: Daily Progress: 50         Modality: individual Related Interventions           Use mindfulness meditation and  cognitive therapy techniques to help the client learn to recognize and regulate the negative thought processes associated with depression and to change his/her relationship with these thoughts (see Mindfulness-Based Cognitive Therapy for Depression by Walden Field, and Clyde Canterbury).   Objective Increasingly verbalize hopeful and positive statements regarding self, others, and the future. Target Date: 2022-10-20             Frequency: Daily Progress: 70          Modality: individual Related Interventions           . Objective Learn and implement conflict resolution skills to resolve interpersonal problems. Target Date: 2022-10-20             Frequency: Daily Progress: 50         Modality: individual Related Interventions 3.           Teach conflict resolution skills (e.g., empathy, active listening, "I messages," respectful communication, assertiveness without aggression, compromise); use psychoeducation, modeling, role-playing, and rehearsal to work through several current conflicts; assign homework exercises; review and repeat so as to integrate their use into the client's life. Objective   Learn and implement behavioral strategies to overcome depression. Target Date: 2022-10-20             Frequency: Daily Progress: 50         Modality: individual Related Interventions            Engage the client in "behavioral activation," increasing his/her activity level and contact with sources of reward, while identifying processes that inhibit activation (see Behavioral Activation for Depression by Beverly Gust, Dimidjian, and Herman-Dunn; or assign "Identify and Schedule Pleasant Activities" in the Adult Psychotherapy Homework Planner by [facility]); use behavioral techniques such as instruction, rehearsal, role-playing, role reversal, as needed, to facilitate activity in the client's daily life; reinforce success.   Objective Identify and replace thoughts and beliefs that support depression. Target Date: 2022-10-20             Frequency: Daily Progress: 70         Modality: individual Related Interventions 5.           Conduct Cognitive-Behavioral Therapy (see Cognitive Behavior Therapy by Olevia Bowens; Overcoming Depression by Lynita Lombard al.), beginning with helping the client learn the connection among cognition, depressive feelings, and actions.  Pt participated in development of plan and provided verbal consent.     Jan Fireman, [facility]

## 2022-03-15 ENCOUNTER — Ambulatory Visit: Payer: BC Managed Care – PPO | Admitting: Psychology

## 2022-03-22 ENCOUNTER — Ambulatory Visit (INDEPENDENT_AMBULATORY_CARE_PROVIDER_SITE_OTHER): Payer: BC Managed Care – PPO | Admitting: Psychology

## 2022-03-22 DIAGNOSIS — F330 Major depressive disorder, recurrent, mild: Secondary | ICD-10-CM | POA: Diagnosis not present

## 2022-03-22 NOTE — Progress Notes (Signed)
[facility] Counselor/Therapist Progress Note  Patient ID: Wanda Hammond, MRN: [id number],    Date: 03/22/2022  Time Spent: 8:08am-8:43am   Treatment Type: Individual Therapy Pt is seen for virtual video visit via caregility.  Pt joins from her car at work reporting privacy and counselor from her home office.   Reported Symptoms: Pt reported fatigue.  Pt reported identifying boundaries.  Appearance:  Well Groomed     Behavior: Appropriate  Motor: Normal  Speech/Language:  Normal Rate  Affect: Appropriate  Mood: dysthymic  Thought process: normal  Thought content:   WNL  Sensory/Perceptual disturbances:   WNL  Orientation: oriented to person, place, time/date, and situation  Attention: Good  Concentration: Good  Memory: WNL  Fund of knowledge:  Good  Insight:   Good  Judgment:  Good  Impulse Control: Good   Risk Assessment: Danger to Self:  No Self-injurious Behavior: No Danger to Others: No Duty to Warn:no Physical Aggression / Violence:No  Access to Firearms a concern: No  Gang Involvement:No   Subjective: Counselor assessed pt current functioning per pt report.  Processed w/pt mood and stressors.  Explored w/pt stressors w/ finances, busy scheduled w/ kids and work.  Discussed awareness of need to protect her free time and ways to set boundaries w/.  Processed w/pt grandmother's death and decisions to participate or note in funeral.   Pt affect wnl.  Pt reported tired and fatigue more on work days.  Pt acknowledged not enjoying her work environment currently and waiting to see what offered w/ merge.  Pt discussed her day off and recognized that while spent time w/ friend wasn't self care for self.  Pt discussed how difficulty w/ change in scheduled w/ ex to have free time now and pt setting boundaries to protect that time.  Pt discussed how completed budget and realized operating in red monthly and made decisions to change this.  Pt reported  grandmother died over weekend and currently doesn't plan to attend funeral.  Pt reports she feels closure and not feeling that needs to participate in the community grieving.  Pt recognized that most won't understand, but that she wants to do what she needs for self not to please others.   Interventions: Cognitive Behavioral Therapy, Insight-Oriented, and ACT  Diagnosis:MDD (major depressive disorder), recurrent episode, mild ([facility])  Plan: Pt to f/u 2 weeks for counseling to continue building coping skills for reducing depression.   Pt to f/u as scheduled w/ PCP for medication management.   Treatment Plan 10/19/21 Pt participated in development of plan and provided verbal consent Client Abilities/Strengths  supports: Best friend and mom.   Enjoys: reading   Client Treatment Preferences  pt to continue w/ counseling every 1-2 weeks. pt to continue w/ PCP for medication management.  Client Statement of Needs  "I have seen improvements and my depression isn't as bad as used to be.  I want to explore what makes me happy and gives me joy.  I want to continue to find my voice.  I want to find my determination to meet my health goals".    Treatment Level  outpatient counseling  Symptoms  Depressed or irritable mood. Difficulty w/ motivation. Lack of energy. Low self-esteem- asserting self   Problems Addressed  Unipolar Depression  Goals 1. Alleviate depressive symptoms and return to previous level of effective functioning.   Objective Implement mindfulness techniques for relapse prevention. Target Date: 2022-10-20  Frequency: Daily Progress: 50         Modality: individual Related Interventions           Use mindfulness meditation and cognitive therapy techniques to help the client learn to recognize and regulate the negative thought processes associated with depression and to change his/her relationship with these thoughts (see Mindfulness-Based Cognitive Therapy for Depression  by Walden Field, and Clyde Canterbury).   Objective Increasingly verbalize hopeful and positive statements regarding self, others, and the future. Target Date: 2022-10-20             Frequency: Daily Progress: 70         Modality: individual Related Interventions           . Objective Learn and implement conflict resolution skills to resolve interpersonal problems. Target Date: 2022-10-20             Frequency: Daily Progress: 50         Modality: individual Related Interventions 3.           Teach conflict resolution skills (e.g., empathy, active listening, "I messages," respectful communication, assertiveness without aggression, compromise); use psychoeducation, modeling, role-playing, and rehearsal to work through several current conflicts; assign homework exercises; review and repeat so as to integrate their use into the client's life. Objective   Learn and implement behavioral strategies to overcome depression. Target Date: 2022-10-20             Frequency: Daily Progress: 50         Modality: individual Related Interventions            Engage the client in "behavioral activation," increasing his/her activity level and contact with sources of reward, while identifying processes that inhibit activation (see Behavioral Activation for Depression by Beverly Gust, Dimidjian, and Herman-Dunn; or assign "Identify and Schedule Pleasant Activities" in the Adult Psychotherapy Homework Planner by [facility]); use behavioral techniques such as instruction, rehearsal, role-playing, role reversal, as needed, to facilitate activity in the client's daily life; reinforce success.   Objective Identify and replace thoughts and beliefs that support depression. Target Date: 2022-10-20             Frequency: Daily Progress: 70         Modality: individual Related Interventions 5.           Conduct Cognitive-Behavioral Therapy (see Cognitive Behavior Therapy by Olevia Bowens; Overcoming Depression by Lynita Lombard al.), beginning  with helping the client learn the connection among cognition, depressive feelings, and actions.  Pt participated in development of plan and provided verbal consent.      Jan Fireman, [facility]

## 2022-03-29 ENCOUNTER — Ambulatory Visit: Payer: BC Managed Care – PPO | Admitting: Psychology

## 2022-04-02 ENCOUNTER — Other Ambulatory Visit: Payer: Self-pay | Admitting: Nurse Practitioner

## 2022-04-02 DIAGNOSIS — F332 Major depressive disorder, recurrent severe without psychotic features: Secondary | ICD-10-CM

## 2022-04-05 ENCOUNTER — Ambulatory Visit: Payer: BC Managed Care – PPO | Admitting: Psychology

## 2022-04-12 ENCOUNTER — Ambulatory Visit: Payer: BC Managed Care – PPO | Admitting: Psychology

## 2022-05-03 ENCOUNTER — Ambulatory Visit (INDEPENDENT_AMBULATORY_CARE_PROVIDER_SITE_OTHER): Payer: BC Managed Care – PPO | Admitting: Psychology

## 2022-05-03 DIAGNOSIS — F330 Major depressive disorder, recurrent, mild: Secondary | ICD-10-CM | POA: Diagnosis not present

## 2022-05-03 NOTE — Progress Notes (Signed)
[place] Counselor/Therapist Progress Note  Patient ID: Wanda Hammond, MRN: [id number],    Date: 05/03/2022  Time Spent: 8:01am-8:50am   Treatment Type: Individual Therapy Pt is seen for virtual video visit via caregility.  Pt joins from her car at work reporting privacy and counselor from her home office.   Reported Symptoms: Pt reports some worry/anxiety, but also recognizing improved coping through stressors.   Appearance:  Well Groomed     Behavior: Appropriate  Motor: Normal  Speech/Language:  Normal Rate  Affect: Appropriate  Mood: dysthymic  Thought process: normal  Thought content:   WNL  Sensory/Perceptual disturbances:   WNL  Orientation: oriented to person, place, time/date, and situation  Attention: Good  Concentration: Good  Memory: WNL  Fund of knowledge:  Good  Insight:   Good  Judgment:  Good  Impulse Control: Good   Risk Assessment: Danger to Self:  No Self-injurious Behavior: No Danger to Others: No Duty to Warn:no Physical Aggression / Violence:No  Access to Firearms a concern: No  Gang Involvement:No   Subjective: Counselor assessed pt current functioning per pt report.  Processed w/pt mood and stressors.  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  Reflected focus on what is in her control and opportunities she wants to explored.  Discussed benefits if journaling and what is working for her.  Pt affect wnl.  Pt reported work stressors. Pt reported received offer last week for merger and that received demotion in title along w/ everyone else.  Pt reports her pay will remain same and feels relief that does have job.  Pt discussed wanting to feel valued as employee and not w/ offer.  Pt recognized that she will continue to look for opportunities and further consider return to school to finish degree. Pt reported that she is journaling daily and helpful to "brain dump" before bed.  Pt  reported that although still fatigued and not wanting to get up in morning, making daily choice to do so and feels good about this.   Interventions: Cognitive Behavioral Therapy, Insight-Oriented, and ACT  Diagnosis:MDD (major depressive disorder), recurrent episode, mild ([place])  Plan: Pt to f/u 2 weeks for counseling to continue building coping skills for reducing depression.   Pt to f/u as scheduled w/ PCP for medication management.   Treatment Plan 10/19/21 Pt participated in development of plan and provided verbal consent Client Abilities/Strengths  supports: Best friend and mom.   Enjoys: reading   Client Treatment Preferences  pt to continue w/ counseling every 1-2 weeks. pt to continue w/ PCP for medication management.  Client Statement of Needs  "I have seen improvements and my depression isn't as bad as used to be.  I want to explore what makes me happy and gives me joy.  I want to continue to find my voice.  I want to find my determination to meet my health goals".    Treatment Level  outpatient counseling  Symptoms  Depressed or irritable mood. Difficulty w/ motivation. Lack of energy. Low self-esteem- asserting self   Problems Addressed  Unipolar Depression  Goals 1. Alleviate depressive symptoms and return to previous level of effective functioning.   Objective Implement mindfulness techniques for relapse prevention. Target Date: 2022-10-20             Frequency: Daily Progress: 50         Modality: individual Related Interventions  Use mindfulness meditation and cognitive therapy techniques to help the client learn to recognize and regulate the negative thought processes associated with depression and to change his/her relationship with these thoughts (see Mindfulness-Based Cognitive Therapy for Depression by Chevis Pretty, and Shona Simpson).   Objective Increasingly verbalize hopeful and positive statements regarding self, others, and the future. Target  Date: 2022-10-20             Frequency: Daily Progress: 70         Modality: individual Related Interventions           . Objective Learn and implement conflict resolution skills to resolve interpersonal problems. Target Date: 2022-10-20             Frequency: Daily Progress: 50         Modality: individual Related Interventions 3.           Teach conflict resolution skills (e.g., empathy, active listening, "I messages," respectful communication, assertiveness without aggression, compromise); use psychoeducation, modeling, role-playing, and rehearsal to work through several current conflicts; assign homework exercises; review and repeat so as to integrate their use into the client's life. Objective   Learn and implement behavioral strategies to overcome depression. Target Date: 2022-10-20             Frequency: Daily Progress: 50         Modality: individual Related Interventions            Engage the client in "behavioral activation," increasing his/her activity level and contact with sources of reward, while identifying processes that inhibit activation (see Behavioral Activation for Depression by Katharine Look, Dimidjian, and Herman-Dunn; or assign "Identify and Schedule Pleasant Activities" in the Adult Psychotherapy Homework Planner by [facility]); use behavioral techniques such as instruction, rehearsal, role-playing, role reversal, as needed, to facilitate activity in the client's daily life; reinforce success.   Objective Identify and replace thoughts and beliefs that support depression. Target Date: 2022-10-20             Frequency: Daily Progress: 70         Modality: individual Related Interventions 5.           Conduct Cognitive-Behavioral Therapy (see Cognitive Behavior Therapy by Reola Calkins; Overcoming Depression by Agapito Games al.), beginning with helping the client learn the connection among cognition, depressive feelings, and actions.  Pt participated in development of plan and provided  verbal consent.      Forde Radon, [facility]

## 2022-05-17 ENCOUNTER — Ambulatory Visit: Payer: BC Managed Care – PPO | Admitting: Psychology

## 2022-05-27 ENCOUNTER — Encounter: Payer: BC Managed Care – PPO | Admitting: Nurse Practitioner

## 2022-05-27 ENCOUNTER — Telehealth: Payer: Self-pay | Admitting: Nurse Practitioner

## 2022-05-27 NOTE — Telephone Encounter (Signed)
Pt was a no show 12/15 for a cpe with [facility]. This is her second this year

## 2022-05-27 NOTE — Telephone Encounter (Signed)
2nd no show, fee generated, final warning letter sent via mail and mychart   

## 2022-05-31 ENCOUNTER — Ambulatory Visit (INDEPENDENT_AMBULATORY_CARE_PROVIDER_SITE_OTHER): Payer: BC Managed Care – PPO | Admitting: Psychology

## 2022-05-31 DIAGNOSIS — F330 Major depressive disorder, recurrent, mild: Secondary | ICD-10-CM

## 2022-05-31 NOTE — Progress Notes (Signed)
[facility] Counselor/Therapist Progress Note  Patient ID: Wanda Hammond, MRN: [id number],    Date: 05/31/2022  Time Spent: 8:00am-8:53am   Treatment Type: Individual Therapy Pt is seen for virtual video visit via caregility.  Pt joins from her home, reporting privacy, and counselor from her home office.   Reported Symptoms: Pt reports setting boundaries for self  Appearance:  Well Groomed     Behavior: Appropriate  Motor: Normal  Speech/Language:  Normal Rate  Affect: Appropriate  Mood: dysthymic  Thought process: normal  Thought content:   WNL  Sensory/Perceptual disturbances:   WNL  Orientation: oriented to person, place, time/date, and situation  Attention: Good  Concentration: Good  Memory: WNL  Fund of knowledge:  Good  Insight:   Good  Judgment:  Good  Impulse Control: Good   Risk Assessment: Danger to Self:  No Self-injurious Behavior: No Danger to Others: No Duty to Warn:no Physical Aggression / Violence:No  Access to Firearms a concern: No  Gang Involvement:No   Subjective: Counselor assessed pt current functioning per pt report.  Processed w/pt mood positives and stressors.  Explored w/ pt interactions w/ family, friends, and ex.  Discussed steps taking towards finalizing separation of affairs.  Discussed her focus for self/family w/ work and goals.  Pt affect wnl.  Pt reported off this week from her job and positive for time for self and getting things done and relaxing.  Pt reported that little interactions and separating things w/ her ex that recognizes ready to have finalized legally and to getting in writing.  Pt reported that did apply for another job that fits needs at this point.    Interventions: Cognitive Behavioral Therapy, Insight-Oriented, and ACT  Diagnosis:MDD (major depressive disorder), recurrent episode, mild ([facility])  Plan: Pt to f/u 3 weeks for counseling to continue building coping skills for reducing depression.    Pt to f/u as scheduled w/ PCP for medication management.   Treatment Plan 10/19/21 Pt participated in development of plan and provided verbal consent Client Abilities/Strengths  supports: Best friend and mom.   Enjoys: reading   Client Treatment Preferences  pt to continue w/ counseling every 1-2 weeks. pt to continue w/ PCP for medication management.  Client Statement of Needs  "I have seen improvements and my depression isn't as bad as used to be.  I want to explore what makes me happy and gives me joy.  I want to continue to find my voice.  I want to find my determination to meet my health goals".    Treatment Level  outpatient counseling  Symptoms  Depressed or irritable mood. Difficulty w/ motivation. Lack of energy. Low self-esteem- asserting self   Problems Addressed  Unipolar Depression  Goals 1. Alleviate depressive symptoms and return to previous level of effective functioning.   Objective Implement mindfulness techniques for relapse prevention. Target Date: 2022-10-20             Frequency: Daily Progress: 50         Modality: individual Related Interventions           Use mindfulness meditation and cognitive therapy techniques to help the client learn to recognize and regulate the negative thought processes associated with depression and to change his/her relationship with these thoughts (see Mindfulness-Based Cognitive Therapy for Depression by Chevis Pretty, and Shona Simpson).   Objective Increasingly verbalize hopeful and positive statements regarding self, others, and the future. Target Date: 2022-10-20  Frequency: Daily Progress: 70         Modality: individual Related Interventions           . Objective Learn and implement conflict resolution skills to resolve interpersonal problems. Target Date: 2022-10-20             Frequency: Daily Progress: 50         Modality: individual Related Interventions 3.           Teach conflict resolution skills  (e.g., empathy, active listening, "I messages," respectful communication, assertiveness without aggression, compromise); use psychoeducation, modeling, role-playing, and rehearsal to work through several current conflicts; assign homework exercises; review and repeat so as to integrate their use into the client's life. Objective   Learn and implement behavioral strategies to overcome depression. Target Date: 2022-10-20             Frequency: Daily Progress: 50         Modality: individual Related Interventions            Engage the client in "behavioral activation," increasing his/her activity level and contact with sources of reward, while identifying processes that inhibit activation (see Behavioral Activation for Depression by Katharine Look, Dimidjian, and Herman-Dunn; or assign "Identify and Schedule Pleasant Activities" in the Adult Psychotherapy Homework Planner by [facility]); use behavioral techniques such as instruction, rehearsal, role-playing, role reversal, as needed, to facilitate activity in the client's daily life; reinforce success.   Objective Identify and replace thoughts and beliefs that support depression. Target Date: 2022-10-20             Frequency: Daily Progress: 70         Modality: individual Related Interventions 5.           Conduct Cognitive-Behavioral Therapy (see Cognitive Behavior Therapy by Reola Calkins; Overcoming Depression by Agapito Games al.), beginning with helping the client learn the connection among cognition, depressive feelings, and actions.  Pt participated in development of plan and provided verbal consent.     Forde Radon, [facility]

## 2022-06-21 ENCOUNTER — Ambulatory Visit: Payer: BC Managed Care – PPO | Admitting: Psychology

## 2022-07-04 ENCOUNTER — Encounter: Payer: Self-pay | Admitting: Nurse Practitioner

## 2022-07-04 ENCOUNTER — Ambulatory Visit: Payer: BC Managed Care – PPO | Admitting: Nurse Practitioner

## 2022-07-04 VITALS — BP 110/78 | HR 98 | Ht 64.0 in | Wt 210.2 lb

## 2022-07-04 DIAGNOSIS — J029 Acute pharyngitis, unspecified: Secondary | ICD-10-CM

## 2022-07-04 DIAGNOSIS — H6121 Impacted cerumen, right ear: Secondary | ICD-10-CM

## 2022-07-04 LAB — POCT RAPID STREP A (OFFICE): Rapid Strep A Screen: NEGATIVE

## 2022-07-04 NOTE — Patient Instructions (Signed)
It was great to see you!  You tested negative for strep.  Keep drinking plenty of fluids. You can gargle with warm salt water or take ibuprofen to help with the sore throat.   Let's follow-up if your symptoms worsen or don't improve.   Take care,  Vance Peper, NP

## 2022-07-04 NOTE — Assessment & Plan Note (Signed)
POC strep negative. Outside of the window for flu treatment, declines covid-19 test. Recommend fluids, hot tea, gargle with warm salt water, and taking ibuprofen as needed for sore throat. Follow-up if symptoms worsen or don't improve.

## 2022-07-04 NOTE — Progress Notes (Signed)
Acute Office Visit  Subjective:     Patient ID: Wanda Hammond, female    DOB: 1985/12/21, 37 y.o.   MRN: [id number]  Chief Complaint  Patient presents with   Acute Visit    Started on Saturday w/ sore throat , no fever , no chill, pain on right side jaw line , coughing some  no taking for this.    HPI Patient is in today for sore throat for the last 3 days.  UPPER RESPIRATORY TRACT INFECTION  Fever: no Cough: no Shortness of breath: no Wheezing: no Chest pain: no Chest tightness: no Chest congestion: no Nasal congestion: yes Runny nose: yes Post nasal drip: yes Sneezing: no Sore throat: yes Swollen glands: yes Sinus pressure: no Headache: no Face pain: no Toothache: no Ear pain: yes "right Ear pressure: yes "right Eyes red/itching:no Eye drainage/crusting: no  Vomiting: no Rash: no Fatigue: yes Sick contacts: yes - daughter  Strep contacts: yes - son Context: stable Recurrent sinusitis: no Relief with OTC cold/cough medications: yes  Treatments attempted: nasal saline spray, hot tea with honey   ROS See pertinent positives and negatives per HPI.     Objective:    BP 110/78   Pulse 98   Ht 5\' 4"  (1.626 m)   Wt 210 lb 3.2 oz (95.3 kg)   SpO2 97%   BMI 36.08 kg/m    Physical Exam Vitals and nursing note reviewed.  Constitutional:      General: She is not in acute distress.    Appearance: Normal appearance.  HENT:     Head: Normocephalic.     Right Ear: External ear normal. There is impacted cerumen.     Left Ear: Tympanic membrane, ear canal and external ear normal.     Nose:     Right Sinus: No maxillary sinus tenderness or frontal sinus tenderness.     Left Sinus: No maxillary sinus tenderness or frontal sinus tenderness.     Mouth/Throat:     Pharynx: Posterior oropharyngeal erythema present. No oropharyngeal exudate.  Eyes:     Conjunctiva/sclera: Conjunctivae normal.  Cardiovascular:     Rate and Rhythm: Normal rate and  regular rhythm.     Pulses: Normal pulses.     Heart sounds: Normal heart sounds.  Pulmonary:     Effort: Pulmonary effort is normal.     Breath sounds: Normal breath sounds.  Musculoskeletal:     Cervical back: Normal range of motion.  Skin:    General: Skin is warm.  Neurological:     General: No focal deficit present.     Mental Status: She is alert and oriented to person, place, and time.  Psychiatric:        Mood and Affect: Mood normal.        Behavior: Behavior normal.        Thought Content: Thought content normal.        Judgment: Judgment normal.     Results for orders placed or performed in visit on 07/04/22  POCT rapid strep A  Result Value Ref Range   Rapid Strep A Screen Negative Negative        Assessment & Plan:   Problem List Items Addressed This Visit       Other   Sore throat - Primary    POC strep negative. Outside of the window for flu treatment, declines covid-19 test. Recommend fluids, hot tea, gargle with warm salt water, and taking ibuprofen as needed for  sore throat. Follow-up if symptoms worsen or don't improve.       Relevant Orders   POCT rapid strep A (Completed)   Other Visit Diagnoses     Impacted cerumen of right ear       After obtaining verbal consent, right ear irrigated. She tolerated the procedure well with good results.       No orders of the defined types were placed in this encounter.   Return if symptoms worsen or fail to improve.  Charyl Dancer, NP

## 2022-07-05 ENCOUNTER — Ambulatory Visit (INDEPENDENT_AMBULATORY_CARE_PROVIDER_SITE_OTHER): Payer: BC Managed Care – PPO | Admitting: Psychology

## 2022-07-05 DIAGNOSIS — F330 Major depressive disorder, recurrent, mild: Secondary | ICD-10-CM | POA: Diagnosis not present

## 2022-07-05 NOTE — Progress Notes (Signed)
[facility] Counselor/Therapist Progress Note  Patient ID: Wanda Hammond, MRN: [id number],    Date: 07/05/2022  Time Spent: 8:00am-8:41am   Treatment Type: Individual Therapy Pt is seen for virtual video visit via caregility.  Pt joins from her parked car at work, reporting privacy, and counselor from her home office.   Reported Symptoms: Pt reports mood ok, stressors of busy w/ kid activities  Appearance:  Well Groomed     Behavior: Appropriate  Motor: Normal  Speech/Language:  Normal Rate  Affect: Appropriate  Mood: normal  Thought process: normal  Thought content:   WNL  Sensory/Perceptual disturbances:   WNL  Orientation: oriented to person, place, time/date, and situation  Attention: Good  Concentration: Good  Memory: WNL  Fund of knowledge:  Good  Insight:   Good  Judgment:  Good  Impulse Control: Good   Risk Assessment: Danger to Self:  No Self-injurious Behavior: No Danger to Others: No Duty to Warn:no Physical Aggression / Violence:No  Access to Firearms a concern: No  Gang Involvement:No   Subjective: Counselor assessed pt current functioning per pt report.  Processed w/pt mood and stressors.  Discussed steps taking towards finalizing divorce.  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  Explored ways to have some self care time coming up.  Pt affect wnl.  Pt reported illness has been consistent in home over past several weeks and now she isn't feeling well.  Pt reported that she is working on completing paperwork for filing divorce.  Pt reported on how son's activities w/ travel basketball keep from having down time.  Pt discussed an opportunity this weekend for day free for self and her plans for her self care.  Pt continuing to look for new job.  Pt reported had job offer but no benefits so wasn't able to take. Interventions: Cognitive Behavioral Therapy, Insight-Oriented, and ACT  Diagnosis:MDD (major depressive  disorder), recurrent episode, mild (HCC)  Plan: Pt to f/u 3 weeks for counseling to continue building coping skills for reducing depression.   Pt to f/u as scheduled w/ PCP for medication management.   Treatment Plan 10/19/21 Pt participated in development of plan and provided verbal consent Client Abilities/Strengths  supports: Best friend and mom.   Enjoys: reading   Client Treatment Preferences  pt to continue w/ counseling every 1-2 weeks. pt to continue w/ PCP for medication management.  Client Statement of Needs  "I have seen improvements and my depression isn't as bad as used to be.  I want to explore what makes me happy and gives me joy.  I want to continue to find my voice.  I want to find my determination to meet my health goals".    Treatment Level  outpatient counseling  Symptoms  Depressed or irritable mood. Difficulty w/ motivation. Lack of energy. Low self-esteem- asserting self   Problems Addressed  Unipolar Depression  Goals 1. Alleviate depressive symptoms and return to previous level of effective functioning.   Objective Implement mindfulness techniques for relapse prevention. Target Date: 2022-10-20             Frequency: Daily Progress: 50         Modality: individual Related Interventions           Use mindfulness meditation and cognitive therapy techniques to help the client learn to recognize and regulate the negative thought processes associated with depression and to change his/her relationship with these thoughts (see Mindfulness-Based Cognitive Therapy for  Depression by Walden Field, and Clyde Canterbury).   Objective Increasingly verbalize hopeful and positive statements regarding self, others, and the future. Target Date: 2022-10-20             Frequency: Daily Progress: 70         Modality: individual Related Interventions           . Objective Learn and implement conflict resolution skills to resolve interpersonal problems. Target Date: 2022-10-20              Frequency: Daily Progress: 50         Modality: individual Related Interventions 3.           Teach conflict resolution skills (e.g., empathy, active listening, "I messages," respectful communication, assertiveness without aggression, compromise); use psychoeducation, modeling, role-playing, and rehearsal to work through several current conflicts; assign homework exercises; review and repeat so as to integrate their use into the client's life. Objective   Learn and implement behavioral strategies to overcome depression. Target Date: 2022-10-20             Frequency: Daily Progress: 50         Modality: individual Related Interventions            Engage the client in "behavioral activation," increasing his/her activity level and contact with sources of reward, while identifying processes that inhibit activation (see Behavioral Activation for Depression by Beverly Gust, Dimidjian, and Herman-Dunn; or assign "Identify and Schedule Pleasant Activities" in the Adult Psychotherapy Homework Planner by [facility]); use behavioral techniques such as instruction, rehearsal, role-playing, role reversal, as needed, to facilitate activity in the client's daily life; reinforce success.   Objective Identify and replace thoughts and beliefs that support depression. Target Date: 2022-10-20             Frequency: Daily Progress: 70         Modality: individual Related Interventions 5.           Conduct Cognitive-Behavioral Therapy (see Cognitive Behavior Therapy by Olevia Bowens; Overcoming Depression by Lynita Lombard al.), beginning with helping the client learn the connection among cognition, depressive feelings, and actions.  Pt participated in development of plan and provided verbal consent.       Jan Fireman, [facility]

## 2022-07-11 ENCOUNTER — Ambulatory Visit: Payer: BC Managed Care – PPO | Admitting: Nurse Practitioner

## 2022-07-26 ENCOUNTER — Ambulatory Visit: Payer: BC Managed Care – PPO | Admitting: Psychology

## 2022-08-09 ENCOUNTER — Ambulatory Visit (INDEPENDENT_AMBULATORY_CARE_PROVIDER_SITE_OTHER): Payer: BC Managed Care – PPO | Admitting: Psychology

## 2022-08-09 DIAGNOSIS — F330 Major depressive disorder, recurrent, mild: Secondary | ICD-10-CM | POA: Diagnosis not present

## 2022-08-09 NOTE — Progress Notes (Signed)
[facility] Counselor/Therapist Progress Note  Patient ID: Wanda Hammond, MRN: [id number],    Date: 08/09/2022  Time Spent: 8:00am-8:58am   Treatment Type: Individual Therapy Pt is seen for virtual video visit via caregility.  Pt joins from her parked car at work, reporting privacy, and counselor from her home office.   Reported Symptoms: Pt reports stressors of balancing work, parenting, kids activities, household responsibilities.    Appearance:  Well Groomed     Behavior: Appropriate  Motor: Normal  Speech/Language:  Normal Rate  Affect: Appropriate  Mood: normal  Thought process: normal  Thought content:   WNL  Sensory/Perceptual disturbances:   WNL  Orientation: oriented to person, place, time/date, and situation  Attention: Good  Concentration: Good  Memory: WNL  Fund of knowledge:  Good  Insight:   Good  Judgment:  Good  Impulse Control: Good   Risk Assessment: Danger to Self:  No Self-injurious Behavior: No Danger to Others: No Duty to Warn:no Physical Aggression / Violence:No  Access to Firearms a concern: No  Gang Involvement:No   Subjective: Counselor assessed pt current functioning per pt report.  Processed w/pt stressors and positives.  Explored boundaries w/ dad and ways to maintain healthy boundaries if resumes contact.  Discussed routines, acceptance of limitations and taking small changes to explored what works and doesn't.  Pt affect wnl.  Pt reported illness has continued to impact her household past couple of weeks. Pt reported that her dad has shown interest in talking again and pt discussed this as pattern for dad.  Pt discussed how she is not wanting to resume contact at [facility] time but is considering how to keep boundaries for self if does.  Pt discussed how she is feeling overwhelmed w/ trying to manage between work, kids activities and household responsbilities.  Pt reports she ends up procrastinating the home chores until  something is needed.  Pt wants to find a routine of household responsibilities that works for her and her schedule. Pt discussed starting w/ trying one change.  Interventions: Cognitive Behavioral Therapy, Insight-Oriented, and ACT  Diagnosis:MDD (major depressive disorder), recurrent episode, mild (HCC)  Plan: Pt to f/u 3 weeks for counseling to continue building coping skills for reducing depression.   Pt to f/u as scheduled w/ PCP for medication management.   Treatment Plan 10/19/21 Pt participated in development of plan and provided verbal consent Client Abilities/Strengths  supports: Best friend and mom.   Enjoys: reading   Client Treatment Preferences  pt to continue w/ counseling every 1-2 weeks. pt to continue w/ PCP for medication management.  Client Statement of Needs  "I have seen improvements and my depression isn't as bad as used to be.  I want to explore what makes me happy and gives me joy.  I want to continue to find my voice.  I want to find my determination to meet my health goals".    Treatment Level  outpatient counseling  Symptoms  Depressed or irritable mood. Difficulty w/ motivation. Lack of energy. Low self-esteem- asserting self   Problems Addressed  Unipolar Depression  Goals 1. Alleviate depressive symptoms and return to previous level of effective functioning.   Objective Implement mindfulness techniques for relapse prevention. Target Date: 2022-10-20             Frequency: Daily Progress: 50         Modality: individual Related Interventions           Use mindfulness meditation  and cognitive therapy techniques to help the client learn to recognize and regulate the negative thought processes associated with depression and to change his/her relationship with these thoughts (see Mindfulness-Based Cognitive Therapy for Depression by Walden Field, and Clyde Canterbury).   Objective Increasingly verbalize hopeful and positive statements regarding self, others,  and the future. Target Date: 2022-10-20             Frequency: Daily Progress: 70         Modality: individual Related Interventions           . Objective Learn and implement conflict resolution skills to resolve interpersonal problems. Target Date: 2022-10-20             Frequency: Daily Progress: 50         Modality: individual Related Interventions 3.           Teach conflict resolution skills (e.g., empathy, active listening, "I messages," respectful communication, assertiveness without aggression, compromise); use psychoeducation, modeling, role-playing, and rehearsal to work through several current conflicts; assign homework exercises; review and repeat so as to integrate their use into the client's life. Objective   Learn and implement behavioral strategies to overcome depression. Target Date: 2022-10-20             Frequency: Daily Progress: 50         Modality: individual Related Interventions            Engage the client in "behavioral activation," increasing his/her activity level and contact with sources of reward, while identifying processes that inhibit activation (see Behavioral Activation for Depression by Beverly Gust, Dimidjian, and Herman-Dunn; or assign "Identify and Schedule Pleasant Activities" in the Adult Psychotherapy Homework Planner by [facility]); use behavioral techniques such as instruction, rehearsal, role-playing, role reversal, as needed, to facilitate activity in the client's daily life; reinforce success.   Objective Identify and replace thoughts and beliefs that support depression. Target Date: 2022-10-20             Frequency: Daily Progress: 70         Modality: individual Related Interventions 5.           Conduct Cognitive-Behavioral Therapy (see Cognitive Behavior Therapy by Olevia Bowens; Overcoming Depression by Lynita Lombard al.), beginning with helping the client learn the connection among cognition, depressive feelings, and actions.  Pt participated in development  of plan and provided verbal consent.       Jan Fireman, [facility]

## 2022-08-30 ENCOUNTER — Ambulatory Visit (INDEPENDENT_AMBULATORY_CARE_PROVIDER_SITE_OTHER): Payer: BC Managed Care – PPO | Admitting: Psychology

## 2022-08-30 DIAGNOSIS — F330 Major depressive disorder, recurrent, mild: Secondary | ICD-10-CM

## 2022-08-30 NOTE — Progress Notes (Signed)
[facility] Counselor/Therapist Progress Note  Patient ID: Wanda Hammond, MRN: [id number],    Date: 08/30/2022  Time Spent: 8:02am-8:58am   Treatment Type: Individual Therapy Pt is seen for virtual video visit via caregility.  Pt joins from her parked car at work, reporting privacy, and counselor from her home office.   Reported Symptoms: Pt reports establishing boundaries and awareness of needs for self  Appearance:  Well Groomed     Behavior: Appropriate  Motor: Normal  Speech/Language:  Normal Rate  Affect: Appropriate  Mood: normal  Thought process: normal  Thought content:   WNL  Sensory/Perceptual disturbances:   WNL  Orientation: oriented to person, place, time/date, and situation  Attention: Good  Concentration: Good  Memory: WNL  Fund of knowledge:  Good  Insight:   Good  Judgment:  Good  Impulse Control: Good   Risk Assessment: Danger to Self:  No Self-injurious Behavior: No Danger to Others: No Duty to Warn:no Physical Aggression / Violence:No  Access to Firearms a concern: No  Gang Involvement:No   Subjective: Counselor assessed pt current functioning per pt report.  Processed w/pt stressors and positives. Explored boundaries and communication w/ ex and coparenting together.  Assisted pt w/ awareness of transitions in past year and balance between work, parenting, self.  Discussed times that could take for self and things pt wants to enjoy.   Pt affect wnl.  Pt reported fatigue w/ time change.  Pt reported on stressors w/ kids activities and trying to get things done. Pt reported that she felt good about holding boundary w/ ex with how filing taxes.  Pt discussed awareness of effective communication w/ coparenting. Pt acknowledged that she has had a lot to adjust to in the past year and giving self grace.  Pt discussed wanting to be more intentional about doing something for self that she can enjoy in limited free time.    Interventions:  Cognitive Behavioral Therapy, Insight-Oriented, and ACT  Diagnosis:MDD (major depressive disorder), recurrent episode, mild ([facility])  Plan: Pt to f/u 2 weeks for counseling to continue building coping skills for reducing depression.   Pt to f/u as scheduled w/ PCP for medication management.   Treatment Plan 10/19/21 Pt participated in development of plan and provided verbal consent Client Abilities/Strengths  supports: Best friend and mom.   Enjoys: reading   Client Treatment Preferences  pt to continue w/ counseling every 1-2 weeks. pt to continue w/ PCP for medication management.  Client Statement of Needs  "I have seen improvements and my depression isn't as bad as used to be.  I want to explore what makes me happy and gives me joy.  I want to continue to find my voice.  I want to find my determination to meet my health goals".    Treatment Level  outpatient counseling  Symptoms  Depressed or irritable mood. Difficulty w/ motivation. Lack of energy. Low self-esteem- asserting self   Problems Addressed  Unipolar Depression  Goals 1. Alleviate depressive symptoms and return to previous level of effective functioning.   Objective Implement mindfulness techniques for relapse prevention. Target Date: 2022-10-20             Frequency: Daily Progress: 50         Modality: individual Related Interventions           Use mindfulness meditation and cognitive therapy techniques to help the client learn to recognize and regulate the negative thought processes associated with depression and  to change his/her relationship with these thoughts (see Mindfulness-Based Cognitive Therapy for Depression by Walden Field, and Clyde Canterbury).   Objective Increasingly verbalize hopeful and positive statements regarding self, others, and the future. Target Date: 2022-10-20             Frequency: Daily Progress: 70         Modality: individual Related Interventions           . Objective Learn and  implement conflict resolution skills to resolve interpersonal problems. Target Date: 2022-10-20             Frequency: Daily Progress: 50         Modality: individual Related Interventions 3.           Teach conflict resolution skills (e.g., empathy, active listening, "I messages," respectful communication, assertiveness without aggression, compromise); use psychoeducation, modeling, role-playing, and rehearsal to work through several current conflicts; assign homework exercises; review and repeat so as to integrate their use into the client's life. Objective   Learn and implement behavioral strategies to overcome depression. Target Date: 2022-10-20             Frequency: Daily Progress: 50         Modality: individual Related Interventions            Engage the client in "behavioral activation," increasing his/her activity level and contact with sources of reward, while identifying processes that inhibit activation (see Behavioral Activation for Depression by Beverly Gust, Dimidjian, and Herman-Dunn; or assign "Identify and Schedule Pleasant Activities" in the Adult Psychotherapy Homework Planner by [facility]); use behavioral techniques such as instruction, rehearsal, role-playing, role reversal, as needed, to facilitate activity in the client's daily life; reinforce success.   Objective Identify and replace thoughts and beliefs that support depression. Target Date: 2022-10-20             Frequency: Daily Progress: 70         Modality: individual Related Interventions 5.           Conduct Cognitive-Behavioral Therapy (see Cognitive Behavior Therapy by Olevia Bowens; Overcoming Depression by Lynita Lombard al.), beginning with helping the client learn the connection among cognition, depressive feelings, and actions.  Pt participated in development of plan and provided verbal consent.       Jan Fireman, [facility]

## 2022-09-13 ENCOUNTER — Ambulatory Visit: Payer: BC Managed Care – PPO | Admitting: Psychology

## 2022-10-04 ENCOUNTER — Ambulatory Visit (INDEPENDENT_AMBULATORY_CARE_PROVIDER_SITE_OTHER): Payer: BC Managed Care – PPO | Admitting: Psychology

## 2022-10-04 DIAGNOSIS — F330 Major depressive disorder, recurrent, mild: Secondary | ICD-10-CM | POA: Diagnosis not present

## 2022-10-04 NOTE — Progress Notes (Deleted)
? ? ? ? ? ? ? ? ? ? ? ? ? ? ?  Daphane Odekirk, LCMHC ?

## 2022-10-04 NOTE — Progress Notes (Signed)
[facility] Counselor/Therapist Progress Note  Patient ID: MichMackenize Delgadillo MRN: [id number],    Date: 10/04/2022  Time Spent: 8:00am-8:50am   Treatment Type: Individual Therapy Pt is seen for virtual video visit via caregility.  Pt joins from her parked car at work, reporting privacy, and counselor from her home office.   Reported Symptoms: Pt reports feeling burned out- recognizing needing time for self  Appearance:  Well Groomed     Behavior: Appropriate  Motor: Normal  Speech/Language:  Normal Rate  Affect: Appropriate  Mood: down  Thought process: normal  Thought content:   WNL  Sensory/Perceptual disturbances:   WNL  Orientation: oriented to person, place, time/date, and situation  Attention: Good  Concentration: Good  Memory: WNL  Fund of knowledge:  Good  Insight:   Good  Judgment:  Good  Impulse Control: Good   Risk Assessment: Danger to Self:  No Self-injurious Behavior: No Danger to Others: No Duty to Warn:no Physical Aggression / Violence:No  Access to Firearms a concern: No  Gang Involvement:No   Subjective: Counselor assessed pt current functioning per pt report.  Processed w/pt stressors of work/home/kids balance. Explored changes pt is looking to make w/ work and steps taking towards.  Discussed other ways to have time for self and options for.   Pt affect congruent w/ report of feeling burned out.  Pt reports wanting a break and not able to take any time for work for one.  Pt reports busy w/ kid schedules outside of work.  Pt discussed potential options and opportunities for.  Pt also discussed how is applying for jobs and looking for job that is still m-f but has ability to be flexible w/ hours at times.  Pt focused on recognizing self care needs.   Interventions: Cognitive Behavioral Therapy, Insight-Oriented, and ACT  Diagnosis:MDD (major depressive disorder), recurrent episode, mild  Plan: Pt to f/u 2 weeks for counseling to  continue building coping skills for reducing depression.   Pt to f/u as scheduled w/ PCP for medication management.   Treatment Plan 10/19/21 Pt participated in development of plan and provided verbal consent Client Abilities/Strengths  supports: Best friend and mom.   Enjoys: reading   Client Treatment Preferences  pt to continue w/ counseling every 1-2 weeks. pt to continue w/ PCP for medication management.  Client Statement of Needs  "I have seen improvements and my depression isn't as bad as used to be.  I want to explore what makes me happy and gives me joy.  I want to continue to find my voice.  I want to find my determination to meet my health goals".    Treatment Level  outpatient counseling  Symptoms  Depressed or irritable mood. Difficulty w/ motivation. Lack of energy. Low self-esteem- asserting self   Problems Addressed  Unipolar Depression  Goals 1. Alleviate depressive symptoms and return to previous level of effective functioning.   Objective Implement mindfulness techniques for relapse prevention. Target Date: 2022-10-20             Frequency: Daily Progress: 50         Modality: individual Related Interventions           Use mindfulness meditation and cognitive therapy techniques to help the client learn to recognize and regulate the negative thought processes associated with depression and to change his/her relationship with these thoughts (see Mindfulness-Based Cognitive Therapy for Depression by Chevis Pretty, and Shona Simpson).   Objective Increasingly verbalize hopeful  and positive statements regarding self, others, and the future. Target Date: 2022-10-20             Frequency: Daily Progress: 70         Modality: individual Related Interventions           . Objective Learn and implement conflict resolution skills to resolve interpersonal problems. Target Date: 2022-10-20             Frequency: Daily Progress: 50         Modality: individual Related  Interventions 3.           Teach conflict resolution skills (e.g., empathy, active listening, "I messages," respectful communication, assertiveness without aggression, compromise); use psychoeducation, modeling, role-playing, and rehearsal to work through several current conflicts; assign homework exercises; review and repeat so as to integrate their use into the client's life. Objective   Learn and implement behavioral strategies to overcome depression. Target Date: 2022-10-20             Frequency: Daily Progress: 50         Modality: individual Related Interventions            Engage the client in "behavioral activation," increasing his/her activity level and contact with sources of reward, while identifying processes that inhibit activation (see Behavioral Activation for Depression by Katharine Look, Dimidjian, and Herman-Dunn; or assign "Identify and Schedule Pleasant Activities" in the Adult Psychotherapy Homework Planner by [facility]); use behavioral techniques such as instruction, rehearsal, role-playing, role reversal, as needed, to facilitate activity in the client's daily life; reinforce success.   Objective Identify and replace thoughts and beliefs that support depression. Target Date: 2022-10-20             Frequency: Daily Progress: 70         Modality: individual Related Interventions 5.           Conduct Cognitive-Behavioral Therapy (see Cognitive Behavior Therapy by Reola Calkins; Overcoming Depression by Agapito Games al.), beginning with helping the client learn the connection among cognition, depressive feelings, and actions.  Pt participated in development of plan and provided verbal consent.        Forde Radon, [facility]

## 2022-10-18 ENCOUNTER — Ambulatory Visit (INDEPENDENT_AMBULATORY_CARE_PROVIDER_SITE_OTHER): Payer: BC Managed Care – PPO | Admitting: Psychology

## 2022-10-18 DIAGNOSIS — F330 Major depressive disorder, recurrent, mild: Secondary | ICD-10-CM | POA: Diagnosis not present

## 2022-10-18 NOTE — Progress Notes (Signed)
[facility] Counselor/Therapist Progress Note  Patient ID: Wanda Hammond, MRN: [id number],    Date: 10/18/2022  Time Spent: 8:01am-8:47am   Treatment Type: Individual Therapy Pt is seen for virtual video visit via caregility.  Pt joins from her parked car at work, reporting privacy, and counselor from her home office.   Reported Symptoms: Pt reports fatigued- making changes to reduce amount of commitments.  Appearance:  Well Groomed     Behavior: Appropriate  Motor: Normal  Speech/Language:  Normal Rate  Affect: Appropriate  Mood: down  Thought process: normal  Thought content:   WNL  Sensory/Perceptual disturbances:   WNL  Orientation: oriented to person, place, time/date, and situation  Attention: Good  Concentration: Good  Memory: WNL  Fund of knowledge:  Good  Insight:   Good  Judgment:  Good  Impulse Control: Good   Risk Assessment: Danger to Self:  No Self-injurious Behavior: No Danger to Others: No Duty to Warn:no Physical Aggression / Violence:No  Access to Firearms a concern: No  Gang Involvement:No   Subjective: Counselor assessed pt current functioning per pt report.  Processed w/pt fatigue and stressors w/ work/life/kid activities. Explored changes pt is making to reduce some time commitments this summer.  Discussed job opportunity. pt affect wnl.  Pt reports fatigue.  Pt reports making some changes w/ taking pause on activity going into summer.  Pt discussed how looking to reduce the amount of commitments w/ evenings/weekends.  Pt reports that she has a job opportunity that will start Aug 1 and feels that gives her more flexiblity w/ scheduled as well.    Interventions: Cognitive Behavioral Therapy, Insight-Oriented, and ACT  Diagnosis:MDD (major depressive disorder), recurrent episode, mild (HCC)  Plan: Pt to f/u 2 weeks for counseling to continue building coping skills for reducing depression.   Pt to f/u as scheduled w/ PCP for  medication management.   Treatment Plan 10/19/21 Pt participated in development of plan and provided verbal consent Client Abilities/Strengths  supports: Best friend and mom.   Enjoys: reading   Client Treatment Preferences  pt to continue w/ counseling every 1-2 weeks. pt to continue w/ PCP for medication management.  Client Statement of Needs  "I have seen improvements and my depression isn't as bad as used to be.  I want to explore what makes me happy and gives me joy.  I want to continue to find my voice.  I want to find my determination to meet my health goals".    Treatment Level  outpatient counseling  Symptoms  Depressed or irritable mood. Difficulty w/ motivation. Lack of energy. Low self-esteem- asserting self   Problems Addressed  Unipolar Depression  Goals 1. Alleviate depressive symptoms and return to previous level of effective functioning.   Objective Implement mindfulness techniques for relapse prevention. Target Date: 2022-10-20             Frequency: Daily Progress: 50         Modality: individual Related Interventions           Use mindfulness meditation and cognitive therapy techniques to help the client learn to recognize and regulate the negative thought processes associated with depression and to change his/her relationship with these thoughts (see Mindfulness-Based Cognitive Therapy for Depression by Chevis Pretty, and Shona Simpson).   Objective Increasingly verbalize hopeful and positive statements regarding self, others, and the future. Target Date: 2022-10-20             Frequency: Daily Progress:  70         Modality: individual Related Interventions           . Objective Learn and implement conflict resolution skills to resolve interpersonal problems. Target Date: 2022-10-20             Frequency: Daily Progress: 50         Modality: individual Related Interventions 3.           Teach conflict resolution skills (e.g., empathy, active listening, "I  messages," respectful communication, assertiveness without aggression, compromise); use psychoeducation, modeling, role-playing, and rehearsal to work through several current conflicts; assign homework exercises; review and repeat so as to integrate their use into the client's life. Objective   Learn and implement behavioral strategies to overcome depression. Target Date: 2022-10-20             Frequency: Daily Progress: 50         Modality: individual Related Interventions            Engage the client in "behavioral activation," increasing his/her activity level and contact with sources of reward, while identifying processes that inhibit activation (see Behavioral Activation for Depression by Katharine Look, Dimidjian, and Herman-Dunn; or assign "Identify and Schedule Pleasant Activities" in the Adult Psychotherapy Homework Planner by [facility]); use behavioral techniques such as instruction, rehearsal, role-playing, role reversal, as needed, to facilitate activity in the client's daily life; reinforce success.   Objective Identify and replace thoughts and beliefs that support depression. Target Date: 2022-10-20             Frequency: Daily Progress: 70         Modality: individual Related Interventions 5.           Conduct Cognitive-Behavioral Therapy (see Cognitive Behavior Therapy by Reola Calkins; Overcoming Depression by Agapito Games al.), beginning with helping the client learn the connection among cognition, depressive feelings, and actions.  Pt participated in development of plan and provided verbal consent.     Forde Radon, [facility]

## 2022-11-01 ENCOUNTER — Ambulatory Visit: Payer: BC Managed Care – PPO | Admitting: Psychology

## 2022-11-03 ENCOUNTER — Ambulatory Visit (INDEPENDENT_AMBULATORY_CARE_PROVIDER_SITE_OTHER): Payer: BC Managed Care – PPO | Admitting: Psychology

## 2022-11-03 DIAGNOSIS — F330 Major depressive disorder, recurrent, mild: Secondary | ICD-10-CM

## 2022-11-03 NOTE — Progress Notes (Signed)
[facility] Counselor Annual Adult Exam  Name: Wanda Hammond Date: 11/03/2022 MRN: [id number] DOB: 1985/07/15 PCP: Anne Ng, NP  Time spent: 8:02am-9:00 am    Pt is seen for a virtual video visit via caregility.  Pt joins from her car outside of work and counselor from her home office.  Pt reports privacy.    Guardian/Payee:  self    Reason for Visit /Presenting Problem: Pt has been working w/ this counselor for the past 2 years and in the last year has been attending biweekly to monthly counseling visits to assist coping w/ depression.  Pt was initially referred by her PCP.  Pt has been more consistent w/ medication management from her PCP.  Pt reports counseling has been beneficial for her and depressive severity has lessened and symptoms decreased.   Pt has had multiple life stressors and changes in the past year.  Pt job went through [employer] and just received a promotion.  Pt is considering job change.  Pt has been separated from her exhusband Jan 2023 and mom completed breast cancer 09/2021.  Pt biggest stressors are adjusting to financial stressor of one income, and balancing work, home, parenting and self care of single parent.   Mental Status Exam: Appearance:   Well Groomed     Behavior:  Appropriate  Motor:  Normal  Speech/Language:   Normal Rate  Affect:  Appropriate  Mood:  normal  Thought process:  normal  Thought content:    WNL  Sensory/Perceptual disturbances:    WNL  Orientation:  oriented to person, place, time/date, and situation  Attention:  Good  Concentration:  Good  Memory:  WNL  Fund of knowledge:   Good  Insight:    Good  Judgment:   Good  Impulse Control:  Good    Reported Symptoms:  Pt endorses fatigue and struggle w/ motivation. Pt reports stuggle to follow through on daily chores. Pt reports sleep improved, but finds that wants to sleep when free time.  Pt reports increased identifying what she values and important for  her.  Pt reports increasing positive self worth.  Pt reports improved assertiveness and setting healthy boundaries w/ family and ex.   Risk Assessment: Danger to Self:  No Self-injurious Behavior: No Danger to Others: No Duty to Warn:no Physical Aggression / Violence:No  Access to Firearms a concern: No  Gang Involvement:No  Patient / guardian was educated about steps to take if suicide or homicide risk level increases between visits: yes While future psychiatric events cannot be accurately predicted, the patient does not currently require acute inpatient psychiatric care and does not currently meet [facility] involuntary commitment criteria.  Substance Abuse History: Current substance abuse: No     Past Psychiatric History:   Previous psychological history is significant for depression Outpatient Providers:current counselor for 2 years and medications prescribed by PCP. History of Psych Hospitalization: No  Psychological Testing:  none    Abuse History:  Victim of: No.,  none reported    Report needed: No. Victim of Neglect:No. Perpetrator of  n/a   Witness / Exposure to Domestic Violence: No   Protective Services Involvement: No  Witness to [employer] Violence:  No   Family History:  Family History  Problem Relation Age of Onset   Diabetes Maternal Grandmother    Down syndrome Brother    Diabetes Mother    Hypertension Mother    Arthritis Mother    Cancer Father  lung   Alcohol abuse Father    Asthma Father    Hypertension Father    Anesthesia problems Neg Hx     Living situation: the patient lives with her children.  Pt separated from her husband Jan 2023.  Pt remained in family home.  Pt shares custody of her son and daughter w/ her husband.  She has physically custody during the week and every other weekend.    Sexual Orientation: Bisexual  Relationship Status: separated from husband of 10 years Jan 2023.   Name of spouse: Ethelene Browns works for [employer] If a  parent, number of children / ages:10y/o son [place] and 6y/o daughter [place].   Support Systems: Bestfriend and her mom.    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 Pt previously worked for [employer] and [employer] for 7 years.  Pt made decision to leave that position as not happy w/ management position and demands.    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  Pt didn't graduate as didn't pass final semester.  Pt never told anyone she didn't graduate.    Religion/Sprituality/World View: Not reported  Any cultural differences that may affect / interfere with treatment:  n/a  Recreation/Hobbies: reading.  Time w/ friend.  Watching son play basketball.    Stressors: Other: changes in past year.  Separation, job stressor, work/life balance    Strengths: Supportive Relationships and Self Advocate  Barriers:  financial stressors, single parent   Legal History: Pending legal issue / charges: The patient has no significant history of legal issues. History of legal issue / charges:  none  Medical History/Surgical History: reviewed Past Medical History:  Diagnosis Date   Ceruminosis, bilateral 07/30/2020   Chicken pox    H/O varicella    Irregular periods/menstrual cycles 09/23/2011   The cycles are short .   Laceration of vaginal wall or sulcus without perineal laceration during delivery 02/19/2012   2nd degree vaginal laceration; just left of pt's ML.  Repaired w/ 3 interrupted stitches   No pertinent past medical history    NSVD (normal spontaneous vaginal delivery) 02/19/2012   UTI (urinary tract infection)     No past surgical history on file.  Medications: Current Outpatient Medications  Medication Sig Dispense Refill   buPROPion (WELLBUTRIN XL) 150 MG 24 hr tablet Take 1 tablet (150  mg total) by mouth daily. 90 tablet 3   levonorgestrel (MIRENA) 20 MCG/24HR IUD Mirena 20 mcg/24 hours (5 yrs) 52 mg intrauterine device  Take 1 insert by intrauterine route.     vitamin B-12 (CYANOCOBALAMIN) 1000 MCG tablet Take 1 tablet (1,000 mcg total) by mouth daily. 90 tablet 1   Vitamin D, Ergocalciferol, (DRISDOL) 1.25 MG (50000 UNIT) CAPS capsule Take 1 capsule (50,000 Units total) by mouth every 7 (seven) days. 12 capsule 0   No current facility-administered medications for this visit.    Allergies  Allergen Reactions   Latex Itching and Rash    Diagnoses:  MDD (major depressive disorder), recurrent episode, mild (HCC)  Plan of Care:  Pt is a 36y/o female who has been attending counseling consistently for past year for depression.  Pt also is taking her medication for depression as prescribed by PCP.  Pt endorses progress w/ counseling but still shrugging w/ motivation and fatigue.  Pt is  seeking continued support for coping w/ depression and stressors.  Pt to f/u w/ counseling 1-2 times a month and continue medication management w/ her PCP.   Treatment Plan 11/03/22 Pt participated in development of plan and provided verbal consent Client Abilities/Strengths  supports: Best friend and mom.   Enjoys: reading  Client Treatment Preferences  pt to continue w/ counseling 1-2 time a month. pt to continue w/ PCP for medication management.  Client Statement of Needs  "I have seen improvements and my depression isn't as bad as used to be.  I want to explore what makes me happy and gives me joy.  I want to try to break this cycle of wanting to sleep and not do other things and find what is motivating for myself"    Treatment Level  outpatient counseling  Symptoms  Depressed or irritable mood. Difficulty w/ motivation. Lack of energy. Low self-esteem- asserting self  Problems Addressed  Unipolar Depression  Goals 1. Alleviate depressive symptoms and return to previous level of  effective functioning.  Objective Implement mindfulness techniques for relapse prevention. Target Date: 2023-11-03             Frequency: Daily Progress: 50         Modality: individual Related Interventions           Use mindfulness meditation and cognitive therapy techniques to help the client learn to recognize and regulate the negative thought processes associated with depression and to change his/her relationship with these thoughts (see Mindfulness-Based Cognitive Therapy for Depression by Chevis Pretty, and Shona Simpson).  Objective Increasingly verbalize hopeful and positive statements regarding self, others, and the future. Target Date: 2023-11-03             Frequency: Daily Progress: 70         Modality: individual Related Interventions           . Objective Learn and implement conflict resolution skills to resolve interpersonal problems. Target Date: 2023-11-03             Frequency: Daily Progress: 70         Modality: individual Related Interventions 3.           Teach conflict resolution skills (e.g., empathy, active listening, "I messages," respectful communication, assertiveness without aggression, compromise); use psychoeducation, modeling, role-playing, and rehearsal to work through several current conflicts; assign homework exercises; review and repeat so as to integrate their use into the client's life. Objective  Learn and implement behavioral strategies to overcome depression. Target Date: 2023-11-03             Frequency: Daily Progress: 50         Modality: individual Related Interventions            Engage the client in "behavioral activation," increasing his/her activity level and contact with sources of reward, while identifying processes that inhibit activation (see Behavioral Activation for Depression by Katharine Look, Dimidjian, and Herman-Dunn; or assign "Identify and Schedule Pleasant Activities" in the Adult Psychotherapy Homework Planner by [facility]); use  behavioral techniques such as instruction, rehearsal, role-playing, role reversal, as needed, to facilitate activity in the client's daily life; reinforce success.  Objective Identify and replace thoughts and beliefs that support depression. Target Date: 2023-11-03             Frequency: Daily Progress: 70         Modality: individual Related Interventions 5.           Conduct Cognitive-Behavioral  Therapy (see Cognitive Behavior Therapy by Reola Calkins; Overcoming Depression by Agapito Games al.), beginning with helping the client learn the connection among cognition, depressive feelings, and actions.    Forde Radon, [facility]

## 2022-11-04 NOTE — Addendum Note (Signed)
Addended by: Clarene Essex on: 11/04/2022 11:56 AM   Modules accepted: Level of Service

## 2022-11-29 ENCOUNTER — Ambulatory Visit (INDEPENDENT_AMBULATORY_CARE_PROVIDER_SITE_OTHER): Payer: BC Managed Care – PPO | Admitting: Psychology

## 2022-11-29 DIAGNOSIS — F330 Major depressive disorder, recurrent, mild: Secondary | ICD-10-CM

## 2022-11-29 NOTE — Progress Notes (Signed)
Behavioral Health Counselor/Therapist Progress Note  Patient ID: Wanda Hammond, MRN: [id number],    Date: 11/29/2022  Time Spent: 8:01am-8:35am   Treatment Type: Individual Therapy Pt is seen for virtual video visit via caregility.  Pt joins from her parked car at work, reporting privacy, and counselor from her home office.   Reported Symptoms: Pt reports made decision about job change. Pt reports feeling energized about.  Pt reports increased engagement and interest.    Appearance:  Well Groomed     Behavior: Appropriate  Motor: Normal  Speech/Language:  Normal Rate  Affect: Appropriate  Mood: normal  Thought process: normal  Thought content:   WNL  Sensory/Perceptual disturbances:   WNL  Orientation: oriented to person, place, time/date, and situation  Attention: Good  Concentration: Good  Memory: WNL  Fund of knowledge:  Good  Insight:   Good  Judgment:  Good  Impulse Control: Good   Risk Assessment: Danger to Self:  No Self-injurious Behavior: No Danger to Others: No Duty to Warn:no Physical Aggression / Violence:No  Access to Firearms a concern: No  Gang Involvement:No   Subjective: Counselor assessed pt current functioning per pt report.  Processed w/pt decision about job change.  Explored transition to summer schedule and positives of taking time for self.  Discussed next steps w/ engaging in activities of interest.  pt affect wnl.  Pt reports improved engagement in activities.  Pt reported she has made decision for job change and feels positive and energized about change.  Pt reported on summer schedule and feels good that still waking at normal time and utilizing as her time for self.  Pt reported that also is getting self care time on Monday evenings and sees as positive.  Pt reported that she is looking into couple activities to try- tennis and or dance.    Interventions: Cognitive Behavioral Therapy, Insight-Oriented, and ACT  Diagnosis:MDD  (major depressive disorder), recurrent episode, mild (HCC)  Plan: Pt to f/u 2 weeks for counseling to continue building coping skills for reducing depression.   Pt to f/u as scheduled w/ PCP for medication management.    Treatment Plan 11/03/22 Pt participated in development of plan and provided verbal consent Client Abilities/Strengths  supports: Best friend and mom.   Enjoys: reading  Client Treatment Preferences  pt to continue w/ counseling 1-2 time a month. pt to continue w/ PCP for medication management.  Client Statement of Needs  "I have seen improvements and my depression isn't as bad as used to be.  I want to explore what makes me happy and gives me joy.  I want to try to break this cycle of wanting to sleep and not do other things and find what is motivating for myself"    Treatment Level  outpatient counseling  Symptoms  Depressed or irritable mood. Difficulty w/ motivation. Lack of energy. Low self-esteem- asserting self  Problems Addressed  Unipolar Depression  Goals 1. Alleviate depressive symptoms and return to previous level of effective functioning.  Objective Implement mindfulness techniques for relapse prevention. Target Date: 2023-11-03             Frequency: Daily Progress: 50         Modality: individual Related Interventions           Use mindfulness meditation and cognitive therapy techniques to help the client learn to recognize and regulate the negative thought processes associated with depression and to change his/her relationship with these thoughts (see  Mindfulness-Based Cognitive Therapy for Depression by Chevis Pretty, and Shona Simpson).  Objective Increasingly verbalize hopeful and positive statements regarding self, others, and the future. Target Date: 2023-11-03             Frequency: Daily Progress: 70         Modality: individual Related Interventions           . Objective Learn and implement conflict resolution skills to resolve  interpersonal problems. Target Date: 2023-11-03             Frequency: Daily Progress: 70         Modality: individual Related Interventions 3.           Teach conflict resolution skills (e.g., empathy, active listening, "I messages," respectful communication, assertiveness without aggression, compromise); use psychoeducation, modeling, role-playing, and rehearsal to work through several current conflicts; assign homework exercises; review and repeat so as to integrate their use into the client's life. Objective  Learn and implement behavioral strategies to overcome depression. Target Date: 2023-11-03             Frequency: Daily Progress: 50         Modality: individual Related Interventions            Engage the client in "behavioral activation," increasing his/her activity level and contact with sources of reward, while identifying processes that inhibit activation (see Behavioral Activation for Depression by Katharine Look, Dimidjian, and Herman-Dunn; or assign "Identify and Schedule Pleasant Activities" in the Adult Psychotherapy Homework Planner by [facility]); use behavioral techniques such as instruction, rehearsal, role-playing, role reversal, as needed, to facilitate activity in the client's daily life; reinforce success.  Objective Identify and replace thoughts and beliefs that support depression. Target Date: 2023-11-03             Frequency: Daily Progress: 70         Modality: individual Related Interventions 5.           Conduct Cognitive-Behavioral Therapy (see Cognitive Behavior Therapy by Reola Calkins; Overcoming Depression by Agapito Games al.), beginning with helping the client learn the connection among cognition, depressive feelings, and actions.    Forde Radon [facility]               [place], [facility]

## 2022-12-13 ENCOUNTER — Ambulatory Visit (INDEPENDENT_AMBULATORY_CARE_PROVIDER_SITE_OTHER): Payer: BC Managed Care – PPO | Admitting: Psychology

## 2022-12-13 DIAGNOSIS — F330 Major depressive disorder, recurrent, mild: Secondary | ICD-10-CM | POA: Diagnosis not present

## 2022-12-13 NOTE — Progress Notes (Signed)
[place] Behavioral [facility] Counselor/Therapist Progress Note  Patient ID: Wanda Hammond, MRN: [id number],    Date: 12/13/2022  Time Spent: 8:02am-8:56am   Treatment Type: Individual Therapy Pt is seen for virtual video visit via caregility.  Pt consents to telehealth session and is aware of limitations of telehealth visits. Pt joins from her parked car at work, reporting privacy, and counselor from her home office.   Reported Symptoms: Pt reports struggling w/ depression related to not meeting her self expectations.    Appearance:  Well Groomed     Behavior: Appropriate  Motor: Normal  Speech/Language:  Normal Rate  Affect: Appropriate  Mood: depressed  Thought process: normal  Thought content:   WNL  Sensory/Perceptual disturbances:   WNL  Orientation: oriented to person, place, time/date, and situation  Attention: Good  Concentration: Good  Memory: WNL  Fund of knowledge:  Good  Insight:   Good  Judgment:  Good  Impulse Control: Good   Risk Assessment: Danger to Self:  No Self-injurious Behavior: No Danger to Others: No Duty to Warn:no Physical Aggression / Violence:No  Access to Firearms a concern: No  Gang Involvement:No   Subjective: Counselor assessed pt current functioning per pt report.  Processed w/pt depressed mood and connection to thoughts/beliefs/expectations.  Assisted pt in acknowledging expectations unrealistic and continued to place on self.  Explored self compassion practices and reframing distortions.   pt affect wnl.  Pt reports looking forward to job change, pt reports enjoying week for down time w/ kids at grandparents.  Pt discussed continued struggle of not following through w/ things as wants and putting expectations on self. Pt able to recognize expectations not realistic and struggling to give self grace and change expectations.  Pt also discussed feeling that needs to prove she can make it as single mom. Pt receptive to self compassion  practices.   Interventions: Cognitive Behavioral Therapy, Insight-Oriented, and ACT and self compassion  Diagnosis:MDD (major depressive disorder), recurrent episode, mild (HCC)  Plan: Pt to f/u 2 weeks for counseling to continue building coping skills for reducing depression.   Pt to f/u as scheduled w/ PCP for medication management.    Treatment Plan 11/03/22 Pt participated in development of plan and provided verbal consent Client Abilities/Strengths  supports: Best friend and mom.   Enjoys: reading  Client Treatment Preferences  pt to continue w/ counseling 1-2 time a month. pt to continue w/ PCP for medication management.  Client Statement of Needs  "I have seen improvements and my depression isn't as bad as used to be.  I want to explore what makes me happy and gives me joy.  I want to try to break this cycle of wanting to sleep and not do other things and find what is motivating for myself"    Treatment Level  outpatient counseling  Symptoms  Depressed or irritable mood. Difficulty w/ motivation. Lack of energy. Low self-esteem- asserting self  Problems Addressed  Unipolar Depression  Goals 1. Alleviate depressive symptoms and return to previous level of effective functioning.  Objective Implement mindfulness techniques for relapse prevention. Target Date: 2023-11-03             Frequency: Daily Progress: 50         Modality: individual Related Interventions           Use mindfulness meditation and cognitive therapy techniques to help the client learn to recognize and regulate the negative thought processes associated with depression and to change his/her  relationship with these thoughts (see Mindfulness-Based Cognitive Therapy for Depression by Chevis Pretty, and Shona Simpson).  Objective Increasingly verbalize hopeful and positive statements regarding self, others, and the future. Target Date: 2023-11-03             Frequency: Daily Progress: 70         Modality:  individual Related Interventions           . Objective Learn and implement conflict resolution skills to resolve interpersonal problems. Target Date: 2023-11-03             Frequency: Daily Progress: 70         Modality: individual Related Interventions 3.           Teach conflict resolution skills (e.g., empathy, active listening, "I messages," respectful communication, assertiveness without aggression, compromise); use psychoeducation, modeling, role-playing, and rehearsal to work through several current conflicts; assign homework exercises; review and repeat so as to integrate their use into the client's life. Objective  Learn and implement behavioral strategies to overcome depression. Target Date: 2023-11-03             Frequency: Daily Progress: 50         Modality: individual Related Interventions            Engage the client in "behavioral activation," increasing his/her activity level and contact with sources of reward, while identifying processes that inhibit activation (see Behavioral Activation for Depression by Katharine Look, Dimidjian, and Herman-Dunn; or assign "Identify and Schedule Pleasant Activities" in the Adult Psychotherapy Homework Planner by [facility]); use behavioral techniques such as instruction, rehearsal, role-playing, role reversal, as needed, to facilitate activity in the client's daily life; reinforce success.  Objective Identify and replace thoughts and beliefs that support depression. Target Date: 2023-11-03             Frequency: Daily Progress: 70         Modality: individual Related Interventions 5.           Conduct Cognitive-Behavioral Therapy (see Cognitive Behavior Therapy by Reola Calkins; Overcoming Depression by Agapito Games al.), beginning with helping the client learn the connection among cognition, depressive feelings, and actions.       Forde Radon [facility]               [place], [facility]

## 2022-12-27 ENCOUNTER — Ambulatory Visit: Payer: BC Managed Care – PPO | Admitting: Psychology

## 2023-01-10 ENCOUNTER — Ambulatory Visit (INDEPENDENT_AMBULATORY_CARE_PROVIDER_SITE_OTHER): Payer: BC Managed Care – PPO | Admitting: Psychology

## 2023-01-10 DIAGNOSIS — F330 Major depressive disorder, recurrent, mild: Secondary | ICD-10-CM

## 2023-01-10 NOTE — Progress Notes (Signed)
[place] [facility] Counselor/Therapist Progress Note  Patient ID: Wanda Hammond, MRN: [id number],    Date: 01/10/2023  Time Spent: 8:03am-8:58am   Treatment Type: Individual Therapy Pt is seen for virtual video visit via caregility.  Pt consents to telehealth session and is aware of limitations of telehealth visits. Pt joins from her home, reporting privacy, and counselor from her home office.   Reported Symptoms: Pt reports looking forward to new start on things.  Pt increased insight and awareness of how her decisions have impacted her.  Appearance:  Well Groomed     Behavior: Appropriate  Motor: Normal  Speech/Language:  Normal Rate  Affect: Appropriate  Mood: depressed  Thought process: normal  Thought content:   WNL  Sensory/Perceptual disturbances:   WNL  Orientation: oriented to person, place, time/date, and situation  Attention: Good  Concentration: Good  Memory: WNL  Fund of knowledge:  Good  Insight:   Good  Judgment:  Good  Impulse Control: Good   Risk Assessment: Danger to Self:  No Self-injurious Behavior: No Danger to Others: No Duty to Warn:no Physical Aggression / Violence:No  Access to Firearms a concern: No  Gang Involvement:No   Subjective: Counselor assessed pt current functioning per pt report.  Processed w/pt recent and upcoming decisions.  Explored process of separation, her decisions and next steps for self.  Assisted in continuing to reevaluate self expectations and allowing for grace. pt affect wnl.  Pt reports she enjoyed her vacation last week and couple days this week prior to start of new job this week.  Pt reported that she and ex will file at court tomorrow for their divorce.  Pt discussed awareness of process for self over past 1.5 year of separation.  Pt increased insight into her own role in relationship and how quick to make decisions for self in past year and impact that has had financially.  Pt discussed how she is aware  she is hard on self, working on setting healthy expectations for self and wants to focus on keeping connected w/ friendships.    Interventions: Cognitive Behavioral Therapy, Insight-Oriented, and ACT and self compassion  Diagnosis:MDD (major depressive disorder), recurrent episode, mild (HCC)  Plan: Pt to f/u 2 weeks for counseling to continue building coping skills for reducing depression.  Pt is unable to schedule her appointment today due to uncertainty about insurance and when will be effective date.  Pt agrees to contact to schedule once insurance obtained.  Pt to f/u as scheduled w/ PCP for medication management.    Treatment Plan 11/03/22 Pt participated in development of plan and provided verbal consent Client Abilities/Strengths  supports: Best friend and mom.   Enjoys: reading  Client Treatment Preferences  pt to continue w/ counseling 1-2 time a month. pt to continue w/ PCP for medication management.  Client Statement of Needs  "I have seen improvements and my depression isn't as bad as used to be.  I want to explore what makes me happy and gives me joy.  I want to try to break this cycle of wanting to sleep and not do other things and find what is motivating for myself"    Treatment Level  outpatient counseling  Symptoms  Depressed or irritable mood. Difficulty w/ motivation. Lack of energy. Low self-esteem- asserting self  Problems Addressed  Unipolar Depression  Goals 1. Alleviate depressive symptoms and return to previous level of effective functioning.  Objective Implement mindfulness techniques for relapse prevention. Target Date: 2023-11-03  Frequency: Daily Progress: 50         Modality: individual Related Interventions           Use mindfulness meditation and cognitive therapy techniques to help the client learn to recognize and regulate the negative thought processes associated with depression and to change his/her relationship with these thoughts  (see Mindfulness-Based Cognitive Therapy for Depression by Chevis Pretty, and Shona Simpson).  Objective Increasingly verbalize hopeful and positive statements regarding self, others, and the future. Target Date: 2023-11-03             Frequency: Daily Progress: 70         Modality: individual Related Interventions           . Objective Learn and implement conflict resolution skills to resolve interpersonal problems. Target Date: 2023-11-03             Frequency: Daily Progress: 70         Modality: individual Related Interventions 3.           Teach conflict resolution skills (e.g., empathy, active listening, "I messages," respectful communication, assertiveness without aggression, compromise); use psychoeducation, modeling, role-playing, and rehearsal to work through several current conflicts; assign homework exercises; review and repeat so as to integrate their use into the client's life. Objective  Learn and implement behavioral strategies to overcome depression. Target Date: 2023-11-03             Frequency: Daily Progress: 50         Modality: individual Related Interventions            Engage the client in "behavioral activation," increasing his/her activity level and contact with sources of reward, while identifying processes that inhibit activation (see Behavioral Activation for Depression by Katharine Look, Dimidjian, and Herman-Dunn; or assign "Identify and Schedule Pleasant Activities" in the Adult Psychotherapy Homework Planner by [facility]); use behavioral techniques such as instruction, rehearsal, role-playing, role reversal, as needed, to facilitate activity in the client's daily life; reinforce success.  Objective Identify and replace thoughts and beliefs that support depression. Target Date: 2023-11-03             Frequency: Daily Progress: 70         Modality: individual Related Interventions 5.           Conduct Cognitive-Behavioral Therapy (see Cognitive Behavior Therapy by Reola Calkins;  Overcoming Depression by Agapito Games al.), beginning with helping the client learn the connection among cognition, depressive feelings, and actions.      Forde Radon, [facility]

## 2023-01-24 ENCOUNTER — Telehealth: Payer: BC Managed Care – PPO | Admitting: Family Medicine

## 2023-01-24 DIAGNOSIS — H10022 Other mucopurulent conjunctivitis, left eye: Secondary | ICD-10-CM

## 2023-01-24 MED ORDER — POLYMYXIN B-TRIMETHOPRIM 10000-0.1 UNIT/ML-% OP SOLN: 1.0000 [drp] | Freq: Four times a day (QID) | OPHTHALMIC | 0 refills | Status: AC

## 2023-01-24 NOTE — Progress Notes (Signed)
E-Visit for Pink Eye ? ? ?We are sorry that you are not feeling well.  Here is how we plan to help! ? ?Based on what you have shared with me it looks like you have conjunctivitis.  Conjunctivitis is a common inflammatory or infectious condition of the eye that is often referred to as "pink eye".  In most cases it is contagious (viral or bacterial). However, not all conjunctivitis requires antibiotics (ex. Allergic).  We have made appropriate suggestions for you based upon your presentation. ? ?I have prescribed Polytrim Ophthalmic drops 1-2 drops 4 times a day times 5 days ? ?Pink eye can be highly contagious.  It is typically spread through direct contact with secretions, or contaminated objects or surfaces that one may have touched.  Strict handwashing is suggested with soap and water is urged.  If not available, use alcohol based had sanitizer.  Avoid unnecessary touching of the eye.  If you wear contact lenses, you will need to refrain from wearing them until you see no white discharge from the eye for at least 24 hours after being on medication.  You should see symptom improvement in 1-2 days after starting the medication regimen.  Call us if symptoms are not improved in 1-2 days. ? ?Home Care: ?Wash your hands often! ?Do not wear your contacts until you complete your treatment plan. ?Avoid sharing towels, bed linen, personal items with a person who has pink eye. ?See attention for anyone in your home with similar symptoms. ? ?Get Help Right Away If: ?Your symptoms do not improve. ?You develop blurred or loss of vision. ?Your symptoms worsen (increased discharge, pain or redness) ? ? ?Thank you for choosing an e-visit. ? ?Your e-visit answers were reviewed by a board certified advanced clinical practitioner to complete your personal care plan. Depending upon the condition, your plan could have included both over the counter or prescription medications. ? ?Please review your pharmacy choice. Make sure the  pharmacy is open so you can pick up prescription now. If there is a problem, you may contact your provider through MyChart messaging and have the prescription routed to another pharmacy.  Your safety is important to us. If you have drug allergies check your prescription carefully.  ? ?For the next 24 hours you can use MyChart to ask questions about today's visit, request a non-urgent call back, or ask for a work or school excuse. ?You will get an email in the next two days asking about your experience. I hope that your e-visit has been valuable and will speed your recovery. ? ?I provided 5 minutes of non face-to-face time during this encounter for chart review, medication and order placement, as well as and documentation.  ? ?

## 2023-01-26 ENCOUNTER — Telehealth: Payer: BC Managed Care – PPO | Admitting: Nurse Practitioner

## 2023-01-26 DIAGNOSIS — H00014 Hordeolum externum left upper eyelid: Secondary | ICD-10-CM

## 2023-01-26 MED ORDER — BACITRACIN-POLYMYXIN B 500-10000 UNIT/GM OP OINT: 1.0000 | TOPICAL_OINTMENT | Freq: Four times a day (QID) | OPHTHALMIC | 0 refills | Status: AC

## 2023-01-26 NOTE — Progress Notes (Signed)
  E-Visit for Stye   We are sorry that you are not feeling well. Here is how we plan to help!  Based on what you have shared with me it looks like you have a stye.  A stye is an inflammation of the eyelid.  It is often a red, painful lump near the edge of the eyelid that may look like a boil or a pimple.  A stye develops when an infection occurs at the base of an eyelash.   We have made appropriate suggestions for you based upon your presentation: Your symptoms may indicate an infection of the sclera.  The use of  antibiotic eye ointment for a week will help resolve this condition. Use a warm compress prior to applying ointment along your eyelashes   I have sent in : Meds ordered this encounter  Medications   bacitracin-polymyxin b (POLYSPORIN) ophthalmic ointment    Sig: Place 1 Application into the left eye 4 (four) times daily for 5 days. Use warm compress prior is able    Dispense:  3.5 g    Refill:  0     HOME CARE:  Wash your hands often! Let the stye open on its own. Don't squeeze or open it. Don't rub your eyes. This can irritate your eyes and let in bacteria.  If you need to touch your eyes, wash your hands first. Don't wear eye makeup or contact lenses until the area has healed.  GET HELP RIGHT AWAY IF:  Your symptoms do not improve. You develop blurred or loss of vision. Your symptoms worsen (increased discharge, pain or redness).   Thank you for choosing an e-visit.  Your e-visit answers were reviewed by a board certified advanced clinical practitioner to complete your personal care plan. Depending upon the condition, your plan could have included both over the counter or prescription medications.  Please review your pharmacy choice. Make sure the pharmacy is open so you can pick up prescription now. If there is a problem, you may contact your provider through [employer] and have the prescription routed to another pharmacy.  Your safety is important to [place]. If  you have drug allergies check your prescription carefully.   For the next 24 hours you can use MyChart to ask questions about today's visit, request a non-urgent call back, or ask for a work or school excuse. You will get an email in the next two days asking about your experience. I hope that your e-visit has been valuable and will speed your recovery.   I spent approximately 5 minutes reviewing the patient's history, current symptoms and coordinating their care today.
